# Patient Record
Sex: Female | Born: 1950 | Race: White | Hispanic: No | Marital: Married | Smoking: Former smoker
Health system: Southern US, Community
[De-identification: ages and names within clinical notes are randomized; demographics above are authoritative.]

## PROBLEM LIST (undated history)

## (undated) DIAGNOSIS — Z87442 Personal history of urinary calculi: Secondary | ICD-10-CM

## (undated) DIAGNOSIS — K219 Gastro-esophageal reflux disease without esophagitis: Secondary | ICD-10-CM

## (undated) DIAGNOSIS — Z8489 Family history of other specified conditions: Secondary | ICD-10-CM

## (undated) DIAGNOSIS — N393 Stress incontinence (female) (male): Secondary | ICD-10-CM

## (undated) DIAGNOSIS — I10 Essential (primary) hypertension: Secondary | ICD-10-CM

## (undated) DIAGNOSIS — M719 Bursopathy, unspecified: Secondary | ICD-10-CM

## (undated) DIAGNOSIS — Z860100 Personal history of colon polyps, unspecified: Secondary | ICD-10-CM

## (undated) DIAGNOSIS — F32A Depression, unspecified: Secondary | ICD-10-CM

## (undated) DIAGNOSIS — M1990 Unspecified osteoarthritis, unspecified site: Secondary | ICD-10-CM

## (undated) DIAGNOSIS — F329 Major depressive disorder, single episode, unspecified: Secondary | ICD-10-CM

## (undated) DIAGNOSIS — N200 Calculus of kidney: Secondary | ICD-10-CM

## (undated) DIAGNOSIS — N201 Calculus of ureter: Secondary | ICD-10-CM

## (undated) DIAGNOSIS — Z973 Presence of spectacles and contact lenses: Secondary | ICD-10-CM

## (undated) DIAGNOSIS — M5431 Sciatica, right side: Secondary | ICD-10-CM

## (undated) DIAGNOSIS — G43909 Migraine, unspecified, not intractable, without status migrainosus: Secondary | ICD-10-CM

## (undated) DIAGNOSIS — K449 Diaphragmatic hernia without obstruction or gangrene: Secondary | ICD-10-CM

## (undated) DIAGNOSIS — F419 Anxiety disorder, unspecified: Secondary | ICD-10-CM

## (undated) HISTORY — DX: Major depressive disorder, single episode, unspecified: F32.9

## (undated) HISTORY — DX: Essential (primary) hypertension: I10

## (undated) HISTORY — PX: EXTRACORPOREAL SHOCK WAVE LITHOTRIPSY: SHX1557

## (undated) HISTORY — DX: Gastro-esophageal reflux disease without esophagitis: K21.9

## (undated) HISTORY — DX: Depression, unspecified: F32.A

## (undated) HISTORY — PX: CARPAL TUNNEL RELEASE: SHX101

## (undated) HISTORY — PX: TONSILLECTOMY: SUR1361

---

## 1993-11-08 HISTORY — PX: NASAL SEPTUM SURGERY: SHX37

## 1998-07-10 ENCOUNTER — Other Ambulatory Visit: Admission: RE | Admit: 1998-07-10 | Discharge: 1998-07-10 | Payer: Self-pay | Admitting: Internal Medicine

## 1998-08-21 ENCOUNTER — Ambulatory Visit (HOSPITAL_COMMUNITY): Admission: RE | Admit: 1998-08-21 | Discharge: 1998-08-21 | Payer: Self-pay | Admitting: Gastroenterology

## 1999-06-30 ENCOUNTER — Other Ambulatory Visit: Admission: RE | Admit: 1999-06-30 | Discharge: 1999-06-30 | Payer: Self-pay | Admitting: Obstetrics and Gynecology

## 2000-09-07 ENCOUNTER — Other Ambulatory Visit: Admission: RE | Admit: 2000-09-07 | Discharge: 2000-09-07 | Payer: Self-pay | Admitting: Obstetrics and Gynecology

## 2000-10-07 ENCOUNTER — Encounter: Admission: RE | Admit: 2000-10-07 | Discharge: 2001-01-05 | Payer: Self-pay | Admitting: Internal Medicine

## 2000-12-26 ENCOUNTER — Emergency Department (HOSPITAL_COMMUNITY): Admission: EM | Admit: 2000-12-26 | Discharge: 2000-12-26 | Payer: Self-pay | Admitting: Emergency Medicine

## 2001-05-23 ENCOUNTER — Other Ambulatory Visit: Admission: RE | Admit: 2001-05-23 | Discharge: 2001-05-23 | Payer: Self-pay | Admitting: Otolaryngology

## 2001-05-25 ENCOUNTER — Encounter: Payer: Self-pay | Admitting: Emergency Medicine

## 2001-05-25 ENCOUNTER — Emergency Department (HOSPITAL_COMMUNITY): Admission: EM | Admit: 2001-05-25 | Discharge: 2001-05-25 | Payer: Self-pay | Admitting: Emergency Medicine

## 2001-06-23 ENCOUNTER — Ambulatory Visit (HOSPITAL_BASED_OUTPATIENT_CLINIC_OR_DEPARTMENT_OTHER): Admission: RE | Admit: 2001-06-23 | Discharge: 2001-06-23 | Payer: Self-pay | Admitting: Orthopedic Surgery

## 2001-10-20 HISTORY — PX: OTHER SURGICAL HISTORY: SHX169

## 2002-12-14 ENCOUNTER — Other Ambulatory Visit: Admission: RE | Admit: 2002-12-14 | Discharge: 2002-12-14 | Payer: Self-pay | Admitting: Obstetrics and Gynecology

## 2003-01-25 ENCOUNTER — Ambulatory Visit (HOSPITAL_COMMUNITY): Admission: RE | Admit: 2003-01-25 | Discharge: 2003-01-25 | Payer: Self-pay | Admitting: Gastroenterology

## 2003-01-25 ENCOUNTER — Encounter (INDEPENDENT_AMBULATORY_CARE_PROVIDER_SITE_OTHER): Payer: Self-pay | Admitting: *Deleted

## 2003-11-04 ENCOUNTER — Inpatient Hospital Stay (HOSPITAL_COMMUNITY): Admission: RE | Admit: 2003-11-04 | Discharge: 2003-11-07 | Payer: Self-pay | Admitting: Orthopedic Surgery

## 2003-11-04 HISTORY — PX: TOTAL KNEE ARTHROPLASTY: SHX125

## 2004-01-10 ENCOUNTER — Other Ambulatory Visit: Admission: RE | Admit: 2004-01-10 | Discharge: 2004-01-10 | Payer: Self-pay | Admitting: Obstetrics and Gynecology

## 2004-10-01 ENCOUNTER — Emergency Department (HOSPITAL_COMMUNITY): Admission: EM | Admit: 2004-10-01 | Discharge: 2004-10-01 | Payer: Self-pay | Admitting: Emergency Medicine

## 2005-05-04 ENCOUNTER — Encounter: Admission: RE | Admit: 2005-05-04 | Discharge: 2005-05-04 | Payer: Self-pay | Admitting: Internal Medicine

## 2005-05-18 ENCOUNTER — Ambulatory Visit (HOSPITAL_COMMUNITY): Admission: RE | Admit: 2005-05-18 | Discharge: 2005-05-18 | Payer: Self-pay | Admitting: Urology

## 2005-05-18 ENCOUNTER — Ambulatory Visit (HOSPITAL_BASED_OUTPATIENT_CLINIC_OR_DEPARTMENT_OTHER): Admission: RE | Admit: 2005-05-18 | Discharge: 2005-05-18 | Payer: Self-pay | Admitting: Urology

## 2005-05-18 HISTORY — PX: OTHER SURGICAL HISTORY: SHX169

## 2005-05-20 ENCOUNTER — Ambulatory Visit (HOSPITAL_COMMUNITY): Admission: RE | Admit: 2005-05-20 | Discharge: 2005-05-20 | Payer: Self-pay | Admitting: Urology

## 2005-06-07 ENCOUNTER — Ambulatory Visit (HOSPITAL_COMMUNITY): Admission: RE | Admit: 2005-06-07 | Discharge: 2005-06-07 | Payer: Self-pay | Admitting: Urology

## 2005-06-21 ENCOUNTER — Encounter: Admission: RE | Admit: 2005-06-21 | Discharge: 2005-06-21 | Payer: Self-pay | Admitting: Orthopedic Surgery

## 2005-07-26 ENCOUNTER — Ambulatory Visit (HOSPITAL_COMMUNITY): Admission: RE | Admit: 2005-07-26 | Discharge: 2005-07-26 | Payer: Self-pay | Admitting: Orthopedic Surgery

## 2005-07-26 ENCOUNTER — Ambulatory Visit (HOSPITAL_BASED_OUTPATIENT_CLINIC_OR_DEPARTMENT_OTHER): Admission: RE | Admit: 2005-07-26 | Discharge: 2005-07-26 | Payer: Self-pay | Admitting: Orthopedic Surgery

## 2005-07-26 HISTORY — PX: SHOULDER ARTHROSCOPY W/ ROTATOR CUFF REPAIR: SHX2400

## 2006-02-08 ENCOUNTER — Other Ambulatory Visit: Admission: RE | Admit: 2006-02-08 | Discharge: 2006-02-08 | Payer: Self-pay | Admitting: Obstetrics and Gynecology

## 2007-01-09 IMAGING — CR DG ABDOMEN 1V
2 series · 2 of 2 positions shown · non-contrast
Comparison: Unenhanced CT abdomen and pelvis 05/04/2005.

CLINICAL DATA: Right renal calculus. Preoperative evaluation.

ABDOMEN -  AP VIEW  05/18/2005:

[t abdomen supine (1 of 2)]
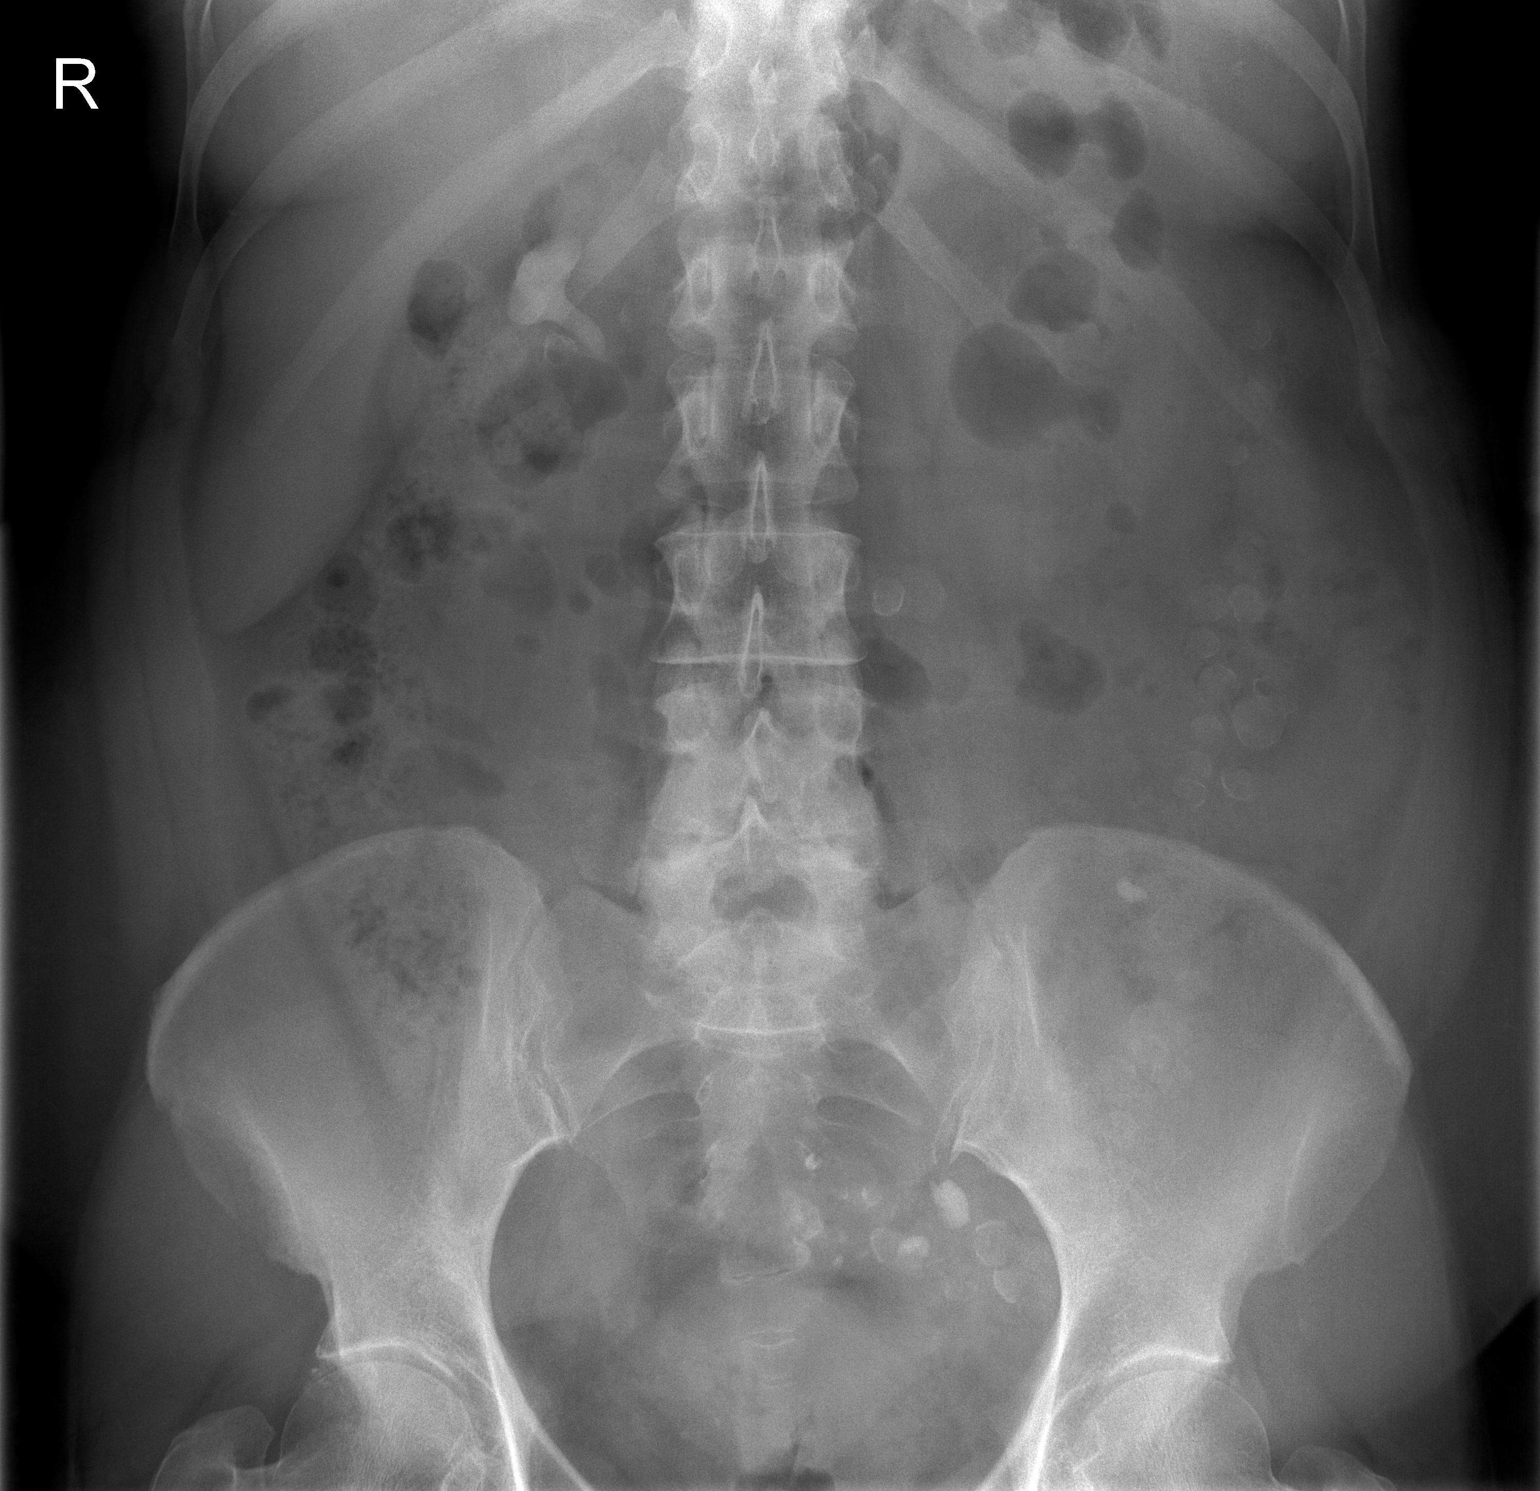

[t abdomen supine (2 of 2)]
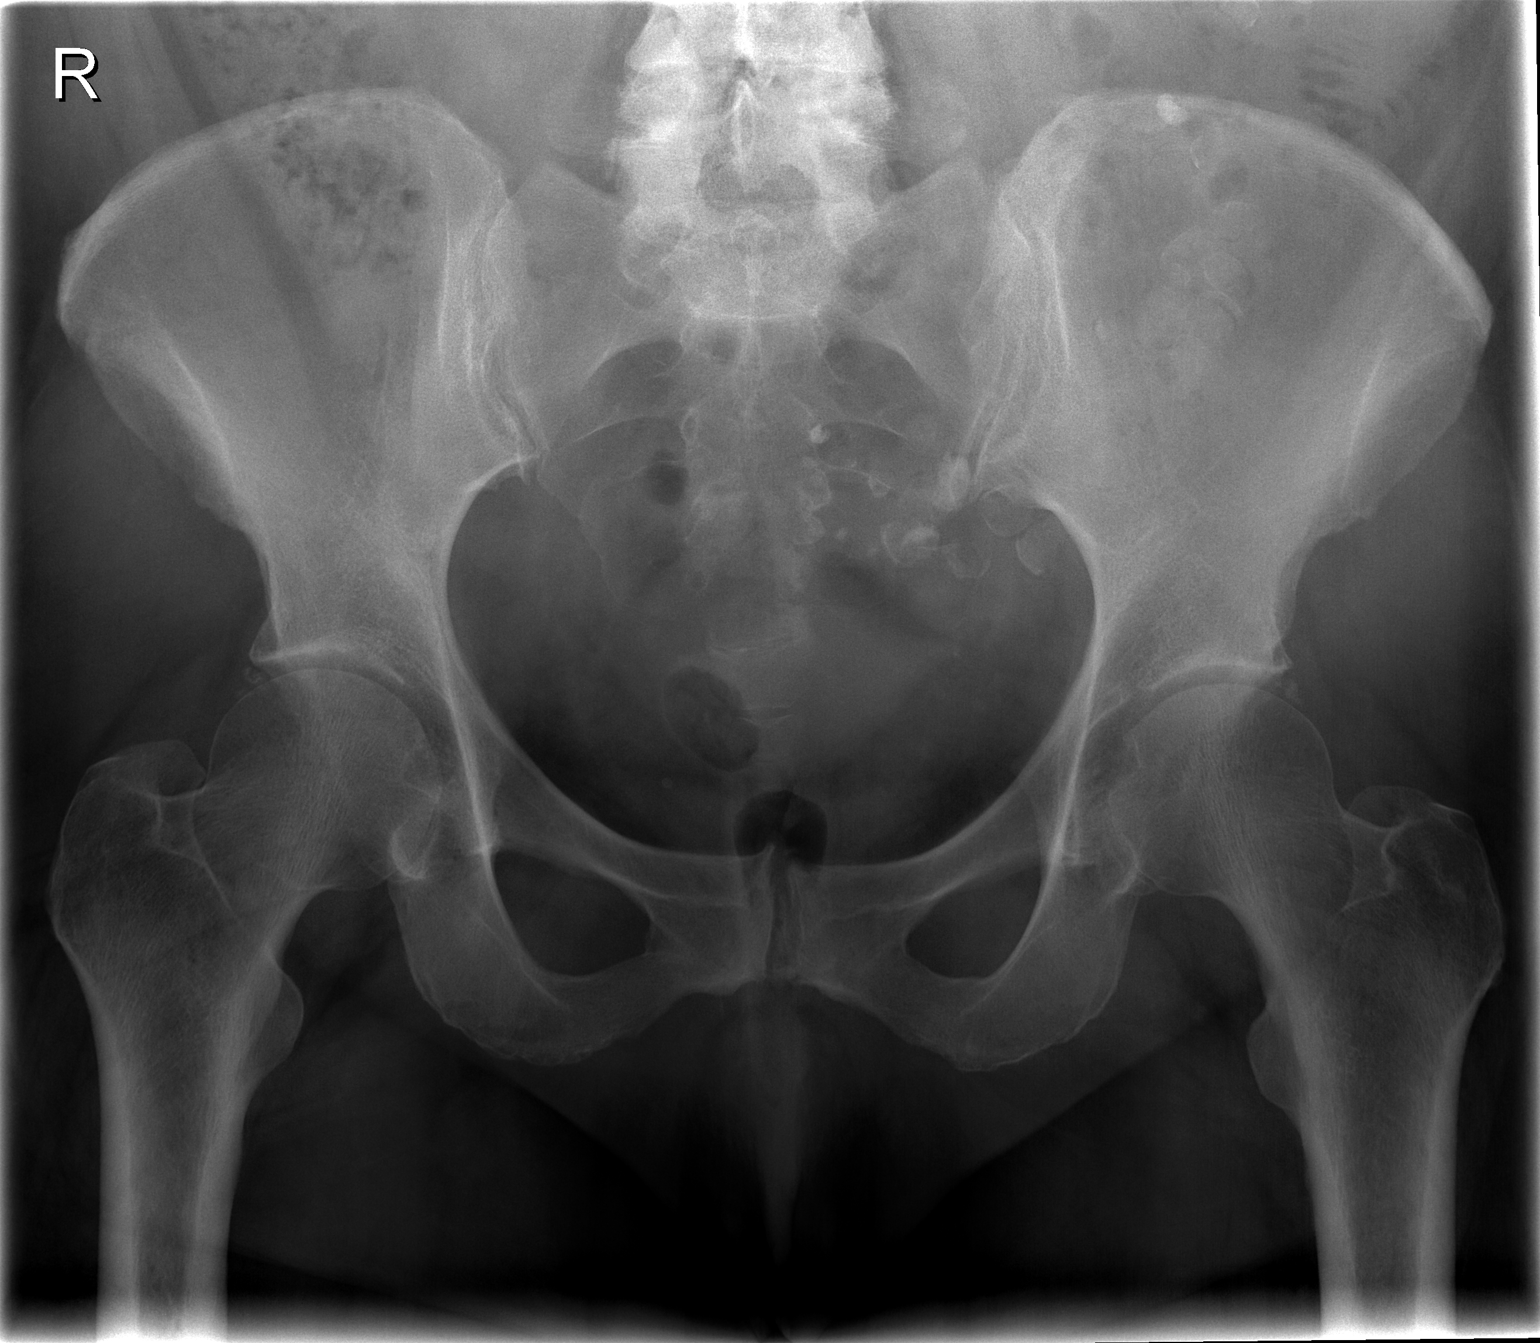

[2 of 2 positions shown; findings below may reference images not displayed]

FINDINGS: The partial right staghorn calculus is again demonstrated. No
definite ureteral calculi are identified. The left urinary tract calculi are
identified. Residual contrast material is present within numerous diverticula in
the transverse, descending and sigmoid colon. The bowel gas pattern is
unremarkable.
IMPRESSION: 1. Partial right staghorn calculus.
2. Numerous diverticula throughout the colon containing residual oral contrast
material.

## 2007-04-02 ENCOUNTER — Ambulatory Visit (HOSPITAL_COMMUNITY): Admission: RE | Admit: 2007-04-02 | Discharge: 2007-04-02 | Payer: Self-pay | Admitting: Emergency Medicine

## 2007-04-03 ENCOUNTER — Observation Stay (HOSPITAL_COMMUNITY): Admission: EM | Admit: 2007-04-03 | Discharge: 2007-04-04 | Payer: Self-pay | Admitting: Emergency Medicine

## 2007-04-03 ENCOUNTER — Encounter (INDEPENDENT_AMBULATORY_CARE_PROVIDER_SITE_OTHER): Payer: Self-pay | Admitting: Surgery

## 2007-04-03 HISTORY — PX: LAPAROSCOPIC APPENDECTOMY: SUR753

## 2007-08-03 ENCOUNTER — Ambulatory Visit (HOSPITAL_BASED_OUTPATIENT_CLINIC_OR_DEPARTMENT_OTHER): Admission: RE | Admit: 2007-08-03 | Discharge: 2007-08-03 | Payer: Self-pay | Admitting: Orthopedic Surgery

## 2007-08-23 ENCOUNTER — Ambulatory Visit (HOSPITAL_COMMUNITY): Admission: RE | Admit: 2007-08-23 | Discharge: 2007-08-23 | Payer: Self-pay | Admitting: Obstetrics and Gynecology

## 2007-08-23 HISTORY — PX: OTHER SURGICAL HISTORY: SHX169

## 2007-08-25 ENCOUNTER — Inpatient Hospital Stay (HOSPITAL_COMMUNITY): Admission: AD | Admit: 2007-08-25 | Discharge: 2007-08-25 | Payer: Self-pay | Admitting: Obstetrics and Gynecology

## 2007-09-08 ENCOUNTER — Ambulatory Visit (HOSPITAL_BASED_OUTPATIENT_CLINIC_OR_DEPARTMENT_OTHER): Admission: RE | Admit: 2007-09-08 | Discharge: 2007-09-08 | Payer: Self-pay | Admitting: Orthopedic Surgery

## 2007-10-26 ENCOUNTER — Encounter: Admission: RE | Admit: 2007-10-26 | Discharge: 2007-10-26 | Payer: Self-pay | Admitting: Internal Medicine

## 2008-09-02 ENCOUNTER — Ambulatory Visit (HOSPITAL_COMMUNITY): Admission: RE | Admit: 2008-09-02 | Discharge: 2008-09-02 | Payer: Self-pay | Admitting: Obstetrics and Gynecology

## 2008-09-02 HISTORY — PX: OTHER SURGICAL HISTORY: SHX169

## 2009-07-04 ENCOUNTER — Encounter: Admission: RE | Admit: 2009-07-04 | Discharge: 2009-07-04 | Payer: Self-pay | Admitting: Neurological Surgery

## 2009-08-04 ENCOUNTER — Encounter: Admission: RE | Admit: 2009-08-04 | Discharge: 2009-08-04 | Payer: Self-pay | Admitting: Orthopedic Surgery

## 2009-09-04 ENCOUNTER — Encounter: Admission: RE | Admit: 2009-09-04 | Discharge: 2009-09-04 | Payer: Self-pay | Admitting: Internal Medicine

## 2009-12-16 ENCOUNTER — Encounter: Admission: RE | Admit: 2009-12-16 | Discharge: 2009-12-16 | Payer: Self-pay | Admitting: Internal Medicine

## 2010-11-29 ENCOUNTER — Encounter: Payer: Self-pay | Admitting: Internal Medicine

## 2010-11-29 ENCOUNTER — Encounter: Payer: Self-pay | Admitting: Orthopedic Surgery

## 2011-03-23 NOTE — H&P (Signed)
[name], [name]                  ACCOUNT NO.:  [id number]   MEDICAL RECORD NO.:  [id number]           PATIENT TYPE:   LOCATION:                                 FACILITY:   PHYSICIAN:  Huel Cote, M.D. DATE OF BIRTH:  [birth year]   DATE OF ADMISSION:  09/23/2007  DATE OF DISCHARGE:                              HISTORY & PHYSICAL   Patient is a 60 year old G1, P1 who is coming in for a scheduled TVT  secure placement and posterior repair given an ongoing problem with a  symptomatic rectocele and significant stress urinary incontinence.  The  patient did have urodynamics testing performed in the office which  revealed a normal functioning bladder, however, there was evidence of a  significant stress urinary incontinence due to hypermobility.  There was  no ISD demonstrated and she had normal flow studies and post void  residuals.  She also has a rectocele which does cause her some pressure  and there is a bulge which has some irritation chronically from rubbing  on her underwear.   PAST MEDICAL HISTORY:  Is significant for chronic hypertension and  reflux disease.   PAST OBSTETRICAL HISTORY:  One normal spontaneous vaginal delivery.   PAST GYN HISTORY:  Does have a history of abnormal Pap smears with  cryosurgery x2 noted.   PAST SURGICAL HISTORY:  She has had nose surgery, carpal tunnel surgery  recently on one arm, tubal ligation, and tonsillectomy.   FAMILY HISTORY:  Is significant for breast cancer in her grandmother at  age 82 and a paternal aunt at age 31.  Her mother had an MI in her 32s  and there is no colon cancer.   CURRENT MEDICATIONS:  Include Prempro 0.3/1.5 daily, Protonix,  lisinopril and Celebrex.   ALLERGIES:  Include SULFA and ASPIRIN sensitivity.   PHYSICAL EXAM:  Her blood pressure is 140/90.  CARDIAC EXAM:  Is regular rate and rhythm.  LUNGS:  Clear.  ABDOMEN:  Soft and nontender.  PELVIC:  Has normal external genitalia noted.  Cervix has no  lesions.  There is a minimal cystocele appreciated and the uterus is normal in  size with good support noted.  The adnexa have no masses.  There is a  mild to moderate rectocele present.   Given the patient's significant stress urinary incontinence that did not  respond to any medical therapy or timed voiding therapy, the patient has  wished to proceed with a tension-free vaginal tape placement.  We  discussed all options including urology referral and she feels  comfortable proceeding with the TVT secure placement in conjunction with  a posterior repair.  All risks and benefits of both procedures were  discussed with the patient in detail by both myself and Dr. Jackelyn Knife  who will be performing the retropubic TVT placement.  Specifically we  discussed bleeding, infection and possible damage to bladder and bowel.  She understands these risks and desires to proceed with the surgery as  stated.  She is also aware that should the TVT secure be problematic,  that a traditional TVT sling  could be placed and would require  additional incision sites and is accepting of this possibility.      Huel Cote, M.D.  Electronically Signed     KR/MEDQ  D:  08/22/2007  T:  08/22/2007  Job:  [id number]

## 2011-03-23 NOTE — Op Note (Signed)
[name], [name]                  ACCOUNT NO.:  [id number]   MEDICAL RECORD NO.:  [id number]          PATIENT TYPE:  AMB   LOCATION:  DSC                          FACILITY:  [facility]   PHYSICIAN:  Katy Fitch. Sypher, M.D. 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   POSTOPERATIVE DIAGNOSIS:  Chronic entrapment neuropathy left median  nerve at wrist.   OPERATIONS:  Release of left transverse carpal ligament.   OPERATIONS:  Katy Fitch. Sypher, M.D.   ASSISTANT:  Molly Maduro Dasnoit PA-C.   ANESTHESIA:  General by LMA.   SUPERVISING ANESTHESIOLOGIST:  Burna Forts, M.D.   INDICATIONS:  The patient is a 60 year old woman referred for evaluation  and management of bilateral hand numbness.  Clinical examination  revealed signs of probable carpal tunnel syndrome.  Electrodiagnostic  studies confirmed significant bilateral carpal tunnel syndrome.   The patient has failed nonoperative measures including splinting,  activity modification and steroid injection into the ulnar bursa.   Due to a failure to respond to nonoperative measures, she is brought to  the operating room at this time for release of her left transverse  carpal ligament.   PROCEDURE:  The patient was brought to the operating room and placed in  supine position upon the operating table.   Following induction of general anesthesia by LMA technique, the left arm  was prepped with Betadine soap and solution and  sterilely draped.  A  pneumatic tourniquet was applied to the proximal left brachium.   Following exsanguination of the limb with Esmarch bandage the  arterial  tourniquet was inflated to 230 mmHg.  The procedure commenced with a  short incision in the line of the ring finger and the palm.  The  subcutaneous tissues were carefully  divided revealing the palmar fascia.  This was split longitudinally to reveal the common sensory branch of the  median nerve.  There was rather unusual anatomy at the distal margin of  the transverse carpal ligament.  There was a pair of muscle bellies that  were inserting on the ligament.  Care was taken to gently spread the  fibers with scissors, looking for an aberrant motor branch.  No aberrant  motor branch was identified.  The ligament was then released along its  ulnar border adjacent to the hook of the hamate extending into the  distal forearm.  This widely opened the carpal canal.  No mass or other  predicaments were noted.   Bleeding points along margin of the released ligament were  electrocauterized with bipolar current followed by repair of the skin  with intradermal 3-0 Prolene.   A compressive dressing was applied with a volar plaster splint  maintaining the wrist in 5 degrees of dorsiflexion.   There were no apparent complications.   For aftercare, the patient is provided a prescription for Dilaudid 2 mg  one or two tablets p.o. q.4-6h. p.r.n. pain 20  tablets without refill.      Katy Fitch Sypher, M.D.  Electronically Signed     RVS/MEDQ  D:  08/03/2007  T:  08/03/2007  Job:  [id number]   cc:   Thora Lance, M.D.

## 2011-03-23 NOTE — Op Note (Signed)
[name], [name]                  ACCOUNT NO.:  [id number]   MEDICAL RECORD NO.:  [id number]          PATIENT TYPE:  OBV   LOCATION:  9311                          FACILITY:  [facility]   PHYSICIAN:  Zenaida Niece, M.D.DATE OF BIRTH:  [birth year]   DATE OF PROCEDURE:  08/23/2007  DATE OF DISCHARGE:  08/23/2007                               OPERATIVE REPORT   PREOPERATIVE DIAGNOSIS:  Stress urinary incontinence.   PROCEDURE:  TVT Secur.   SURGEON:  Zenaida Niece, M.D.   ASSISTANT:  Huel Cote, MD   ANESTHESIA:  Spinal.   ESTIMATED BLOOD LOSS:  150 mL from the TVT Secur.   COMPLICATIONS:  None.   PROCEDURE IN DETAIL:  Dr. Senaida Ores had previously performed a  posterior colporrhaphy.  The patient's legs were slightly lowered in the  stirrups.  The vaginal mucosa was grasped anteriorly in the midline  approximately one to two fingerbreadths from the urethral meatus.  Local  anesthetic with 0.25% Marcaine was infiltrated beneath the vaginal  mucosa.  A vertical incision was made in the vaginal mucosa and the  edges were grasped with the Allis clamps.  Sharp dissection was used to  dissect beneath the vaginal mucosa laterally to the pubic ramus.  This  was done bilaterally with enough width to allow a knife handle to pass  to the pubic ramus.  The TVT Secur device was then opened.  The needle  was first passed on the patient's right side, passing it beneath the  vaginal mucosa and behind the pubic ramus into the obturator internus  muscle.  This was then repeated on the patient's left side.  Cystoscopy  was then performed which revealed a normal bladder and no injury to the  bladder or the urethra.  The TVT Secur device was slightly loose and the  needle on the right side was pushed in a little further and this  achieved good tension.  The needles were then removed without  difficulty.  The vaginal mucosa was then closed with running locking 2-0  Vicryl with adequate  closure and adequate hemostasis.  The patient was  then taken down from stirrups after all instruments were removed from  the vagina.  She was extubated in the operating and taken to the  recovery room in stable condition after tolerating the procedure well.  All counts were correct.  She did received Ancef 2 grams IV prior to the  procedure.      Zenaida Niece, M.D.  Electronically Signed     TDM/MEDQ  D:  08/23/2007  T:  08/24/2007  Job:  [id number]

## 2011-03-23 NOTE — Op Note (Signed)
NAMEABREA, HENLE                  ACCOUNT NO.:  [id number]   MEDICAL RECORD NO.:  [id number]          PATIENT TYPE:  AMB   LOCATION:  SDC                           FACILITY:  [facility]   PHYSICIAN:  Zenaida Niece, M.D.DATE OF BIRTH:  1951-04-01   DATE OF PROCEDURE:  09/02/2008  DATE OF DISCHARGE:                               OPERATIVE REPORT   PREOPERATIVE DIAGNOSIS:  Exposed portion of tension-free vaginal tape.   POSTOPERATIVE DIAGNOSIS:  Exposed portion of tension-free vaginal tape.   PROCEDURE:  Removal of the midportion of her tension-free vaginal tape.   SURGEON:  Zenaida Niece, MD   ANESTHESIA:  IV sedation and local block.   FINDINGS:  The edge of the TVT furthest in the vagina was exposed for  approximately 1 cm in length.   SPECIMENS:  None.   ESTIMATED BLOOD LOSS:  50 mL.   COMPLICATIONS:  None.   PROCEDURE IN DETAIL:  The patient was taken to the operating room and  placed in the dorsal supine position.  She was given IV sedation and  placed in mobile stirrups.  Perineum and vagina were then prepped and  draped in the usual sterile fashion and bladder drained with a red  Robinson catheter.  A short weighted speculum was placed into the vagina  and an [employer] retractor used anteriorly to try and see the portion of  the TVT that was palpable.  A 1-cm segment of the edge that was mostly  in the vagina was visible.  The vaginal edges around this were grasped  with Allis clamps.  The area was infiltrated with 0.5% Marcaine with  epinephrine.  A vertical incision was made inferior to the sling and  right over the sling.  Sharp dissection was then used to expose the  midportion of the sling as laterally as I could.  I was able to  undermine the sling and removed it from all surrounding tissue.  The  sling was then cut laterally first on the patient's left and then  pulling it was cut on the right to remove approximately the middle 1-2  cm of sling.  No further  sling was palpable or visible.  The incision  was then closed in a horizontal fashion with running locking 3-0 Vicryl  with adequate closure and adequate hemostasis.  Further palpation again  revealed no further sling palpable or visible.  All instruments were  then removed from the vagina.  The patient tolerated the procedure well  and she was taken to the recovery room in stable condition.  Counts were  correct x2, she received Ancef 1 g IV prior to the procedure and had PAS  hose on throughout the procedure.     Zenaida Niece, M.D.  Electronically Signed    TDM/MEDQ  D:  09/02/2008  T:  09/02/2008  Job:  [id number]

## 2011-03-23 NOTE — Discharge Summary (Signed)
[name], WEYANDT                  ACCOUNT NO.:  [id number]   MEDICAL RECORD NO.:  [id number]          PATIENT TYPE:  OBV   LOCATION:  9311                          FACILITY:  [facility]   PHYSICIAN:  Sherron Monday, MD        DATE OF BIRTH:  11/14/1950   DATE OF ADMISSION:  08/23/2007  DATE OF DISCHARGE:  08/23/2007                               DISCHARGE SUMMARY   PROCEDURE:  Posterior repair, TVT secure.   HPI:  A 60 year old G1, P1 who was coming in for a scheduled TVT secure  and posterior repair secondary to symptomatic rectocele and significant  stress urinary incontinence which was revealed by urodynamics in our  office.  She also has a large rectocele which causes pressure and has  constant irritation.   PAST MEDICAL HISTORY:  1. Chronic hypertension.  2. Reflux.   PAST OB/GYN HISTORY:  Term spontaneous vaginal delivery.  She has an  history of an abnormal Pap smear with cryosurgery x2, as well as a tubal  ligation.   PAST SURGICAL HISTORY:  1. Nose surgery.  2. Carpal tunnel surgery.  3. Tubal ligation.  4. Tonsillectomy.   FAMILY HISTORY:  Significant for breast cancer in her grandmother and a  paternal aunt.  Coronary artery disease in her mother.  No history of  any colon cancer.   CURRENT MEDICATIONS:  1. Prempro 0.3/1.5.  2. Protonix.  3. Lisinopril.  4. Celebrex.   ALLERGIES:  1. SULFA.  2. ASPIRIN WHICH IS MERELY A SENSITIVITY.   Please see the dictated H&P for physical exam.  She was admitted and  underwent the procedure without difficulty and approximately 200 mL  estimated blood loss.  Her postoperative course was relatively  uncomplicated.  She was  able to void well.  Her pain was well controlled.  She was discharged to  home on postoperative day #0 in the evening.  She was discharged to home  with routine discharge instructions and numbers to call if any questions  or problems, as well as prescription for Motrin and Vicodin.      Sherron Monday,  MD  Electronically Signed     JB/MEDQ  D:  08/23/2007  T:  08/24/2007  Job:  [id number]

## 2011-03-23 NOTE — Op Note (Signed)
NAMEADARA, KITTLE                  ACCOUNT NO.:  [id number]   MEDICAL RECORD NO.:  [id number]          PATIENT TYPE:  AMB   LOCATION:  DSC                          FACILITY:  [facility]   PHYSICIAN:  Katy Fitch. Sypher, M.D. DATE OF BIRTH:  03-05-1951   DATE OF PROCEDURE:  09/08/2007  DATE OF DISCHARGE:                               OPERATIVE REPORT   PREOPERATIVE DIAGNOSIS:  Entrapment neuropathy, median nerve, right  carpal tunnel.   POSTOPERATIVE DIAGNOSIS:  Entrapment neuropathy, median nerve, right  carpal tunnel.   OPERATION:  Release of right transverse carpal ligament.   OPERATING SURGEON:  Katy Fitch. Sypher, MD   ASSISTANT:  Annye Rusk PA-C.   ANESTHESIA:  General by LMA, supervising anesthesiologist is Mick Sell, MD   INDICATIONS:  Alicha Hammond is a 60 year old woman referred by Dr. Kirby Funk for evaluation and management of bilateral carpal tunnel  syndrome.  Clinical examination suggested median neuropathy at wrist  level.  Electrodiagnostic studies confirmed bilateral carpal tunnel  syndrome.   She is status post release of her left transverse carpal ligament with a  good result and now seeks release of the right transverse carpal  ligament.   After informed consent, she is brought to the operating room at this  time.   PROCEDURE:  Nailah Luepke was brought to operating room and placed in a  supine position upon the operating table.   Following the induction of general anesthesia by LMA technique, the  right arm was prepped with Betadine soap and solution and sterilely  draped.  A pneumatic tourniquet was applied to the proximal right  brachium.   Following exsanguination of the right arm with an Esmarch bandage, the  arterial tourniquet was inflated to 220 mmHg and later inflated to 250  mmHg due to the elevation of systolic blood pressure.   The procedure commenced with a short incisional in the line of the ring  finger in the palm.  The  subcutaneous tissues were carefully divided to  reveal the palmar fascia.  The anatomy of the mid palmar space was  obscured by adipose tissue.  With great care the superficial palmar  arch, its perforating vessels and the thenar muscles were isolated and  identified.  The transverse carpal ligament was identified and a  Agricultural engineer used to sound the carpal canal.  The transverse  carpal ligament was isolated and released along its ulnar border with  scissors extending into the distal forearm.   This widely opened the carpal canal.  No masses or other predicaments  were identified.   The wound was then repaired with intradermal 3-0 Prolene suture.  Pressure was held on the distal forearm and volar aspect of the wrist to  prevent formation of a hematoma.  The wound was then dressed with a  Steri-Strip, infiltrated with 2% lidocaine for postoperative analgesia,  and a volar plaster splint placed, maintaining the wrist in 5 degrees of  dorsiflexion.  There were no apparent complications.      Katy Fitch Sypher, M.D.  Electronically Signed  RVS/MEDQ  D:  09/08/2007  T:  09/08/2007  Job:  [id number]   cc:   Thora Lance, M.D.

## 2011-03-23 NOTE — Op Note (Signed)
[name], [name]                  ACCOUNT NO.:  [id number]   MEDICAL RECORD NO.:  [id number]          PATIENT TYPE:  OBV   LOCATION:  9311                          FACILITY:  [facility]   PHYSICIAN:  Huel Cote, M.D. DATE OF BIRTH:  08-Sep-1951   DATE OF PROCEDURE:  08/23/2007  DATE OF DISCHARGE:  08/23/2007                               OPERATIVE REPORT   PREOPERATIVE DIAGNOSES:  1. Rectocele.  2. Stress urinary incontinence.   POSTOPERATIVE DIAGNOSES:  1. Rectocele.  2. Stress urinary incontinence.   PROCEDURE:  Posterior repair and TVT Secur placement.   SURGEONS:  Dr. Huel Cote and Dr. Lavina Hamman.   ANESTHESIA:  General.   FINDINGS:  There was a grade 2 rectocele, repaired.  No specimens were  sent.   ESTIMATED BLOOD LOSS:  200 mL.   URINE OUTPUT:  Approximately 50 mL, straight cath prior to procedure, of  clear urine and another 50 mL during the procedure just after the sling  placement.   IV FLUIDS:  Approximately 1500 mL LR.   PROCEDURE IN DETAIL:  The patient was taken to the operating room where  general anesthesia was obtained without difficulty.  She was then  prepped and draped in normal sterile fashion in the dorsal lithotomy  position.  She was placed in the stirrups prior to anesthesia induction  to assure comfort  in lower extremities, given a knee replacement  surgery in the past and she assured [place] that she was comfortable.  The  patient was draped sterilely in the dorsal lithotomy position after  anesthesia induction and attention was turned to the vagina.  She was  straight cathed prior to procedure and the posterior perineum was  grasped with Allis clamps just at the introital ring.  The hymenal ring  was identified and these Allis clamps placed on it.  A small amount of  vaginal mucosa was trimmed away and the vaginal mucosa was then  underscored in the midline after injection with 0.25% Marcaine solution.  The vaginal mucosa was slowly  dissected off the underlying rectovaginal  fascia and this incision was carried up the midline of the vagina with  Allis clamps placed at the mucosal flap.  This was carried up to  approximately 3 cm below the cervix and the mucosal flaps were then  dissected off the underlying rectovaginal fascia with the Metzenbaum  scissors and the fascia itself was deflected to the midline.  When this  was performed bilaterally, 0 Vicryl was then utilized an interrupted  fashion to reapproximate the rectovaginal fascia and reduce the  rectocele.  This had good result.  At this point, clean gloved was  placed over the right and rectal exam performed with no sutures palpable  in the rectum and only one area of the rectocele felt nonsupported and  after the rectal exam, the glove was removed and one additional of 0  Vicryl interrupted suture was placed over this area.  Thus, with the  rectocele completely reduced, the excess vaginal mucosa was trimmed away  with Mayo scissors and was then closed in  a running locked fashion with  2-0 Vicryl.  Good hemostasis was noted and at this point the surgery was  turned over to Dr. Jackelyn Knife for the TVT Secur placement, which he has  dictated separately.  At the conclusion of the TVT Secur placement, the  patient had no significant active bleeding noted and there was not felt  to be a need for vaginal packing.  She had all instruments and sponges  removed from the vagina and was then awakened and taken to the recovery  room in stable condition she will be admitted as observation status for  possible discharge later today or in the morning following surgery.      Huel Cote, M.D.  Electronically Signed     KR/MEDQ  D:  08/23/2007  T:  08/24/2007  Job:  [id number]

## 2011-03-23 NOTE — H&P (Signed)
[name], [name]                  ACCOUNT NO.:  [id number]   MEDICAL RECORD NO.:  [id number]          PATIENT TYPE:  INP   LOCATION:  0098                         [facility]ACILITY:  [facility]   PHYSICIAN:  Wilmon Arms. Corliss Skains, M.D. DATE OF BIRTH:  [birth year]   DATE OF ADMISSION:  04/03/2007  DATE OF DISCHARGE:                              HISTORY & PHYSICAL   CHIEF COMPLAINT:  Right lower quadrant pain.   HISTORY OF PRESENT ILLNESS:  The patient is a 60 year old female who  presents with onset of diffuse abdominal pain in the middle of the day  on Saturday.  The pain has waxed and waned but has generally become  worse, and it is now localized mostly to the right lower quadrant.  The  patient feels distended.  She has had some diarrhea.  She was seen on an  outpatient basis on Sunday and was sent for a CT scan.  This showed  evidence of acute appendicitis.  She was then sent to the emergency  department for evaluation.   PAST MEDICAL HISTORY:  1. Gastroesophageal reflux.  2. Arthritis.  3. Hypertension.  4. Nephrolithiasis.   PAST SURGICAL HISTORY:  1. Laparoscopic tubal ligation.  2. Left total knee replacement.  3. Left shoulder and other orthopedic surgeries.  4. Cystoscopy with stent placement.  5. Lithotripsy.   FAMILY HISTORY:  None.   SOCIAL HISTORY:  Nonsmoker, nondrinker.   ALLERGIES:  SULFA.  She has itching with OXYCODONE.   MEDICATIONS:  Lisinopril, Prempro, Protonix, Celebrex.   PHYSICAL EXAMINATION:  VITAL SIGNS:  Temperature 98.1, pulse 68,  respirations 18, blood pressure 118/56.  GENERAL:  This is an obese female in no apparent distress.  HEENT:  EOMI.  Sclerae anicteric.  NECK:  No masses, no thyromegaly.  LUNGS: Clear.  Normal respiratory effort.  HEART: Regular rate and rhythm.  No murmur.  ABDOMEN:  Positive bowel sounds, soft and nontender in the right lower  quadrant.  The abdomen is moderately distended.  EXTREMITIES:  No edema.  Multiple incisions on the  lower extremities.  SKIN:  Warm and dry with no sign of jaundice.   LABS:  Hemoglobin 13.1, white count 11.6.  CT scan of the abdomen and  pelvis showed a hiatal hernia, diffuse colonic diverticulosis, acute  appendicitis extending across the pelvis towards the midline.  No sign  of abscess, and uterine fibroids.   IMPRESSION:  Acute appendicitis.   PLAN:  Recommend laparoscopic appendectomy, possible open.   I discussed the procedure with the patient and she wishes to proceed.      Wilmon Arms. Tsuei, M.D.  Electronically Signed     MKT/MEDQ  D:  04/03/2007  T:  04/03/2007  Job:  [id number]

## 2011-03-23 NOTE — Op Note (Signed)
[name], [name]                  ACCOUNT NO.:  [id number]   MEDICAL RECORD NO.:  [id number]          PATIENT TYPE:  AMB   LOCATION:  DSC                          FACILITY:  [facility]   PHYSICIAN:  Wilmon Arms. Corliss Skains, M.D. DATE OF BIRTH:  [birth year]   DATE OF PROCEDURE:  04/03/2007  DATE OF DISCHARGE:                               OPERATIVE REPORT   PREOPERATIVE DIAGNOSIS:  Acute appendicitis.   POSTOPERATIVE DIAGNOSIS:  Acute appendicitis.   PROCEDURE PERFORMED:  Laparoscopic appendectomy.   SURGEON:  Wilmon Arms. Tsuei, M.D.   ANESTHESIA:  General.   INDICATIONS:  The patient is a 60 year old female who presents with 2  days of diffuse abdominal pain, now localized to the right lower  quadrant.  A CT scan confirmed the findings of acute appendicitis, with  no sign of abscess.  She presents now for evaluation and treatment.   DESCRIPTION OF PROCEDURE:  The patient was brought to the operating room  and placed in the supine position on the operating room table, with her  left arm tucked.  After an adequate level of general anesthesia was  obtained, a Foley catheter was placed in a sterile technique.  The  patient's abdomen was prepped with Betadine and draped in sterile  fashion.  Her umbilicus was infiltrated with 0.25% Marcaine.  A  transverse incision was made through her old laparoscopy scar.  Dissection was carried down to the fascia, which was opened vertically.  The peritoneum was bluntly entered.  A stay suture of 0 Vicryl was  placed around the fascial opening.  The Hasson cannula was inserted into  the peritoneal cavity and secured with a stay suture.  Pneumoperitoneum  was obtained by insufflating CO2 and maintaining a maximum pressure of  15 mmHg.  The laparoscope was inserted.  No gross purulence was noted.  A 5 mm port was placed in the left lower quadrant, and another 5 mm port  in the right upper quadrant.  The scope was moved to the right upper  quadrant.  The  patient was positioned in Trendelenburg and tilted to her  left.  We mobilized the cecum.  An inflamed appendix was seen adherent,  in between loops of small bowel in the mid pelvis.  Blunt dissection was  used to free up the tip of the appendix.  The appendix appeared to be  intact but was inflamed.  There was some fibrinous exudate.  The  appendix was grasped with a Babcock clamp and elevated.  The  mesoappendix was then taken down with the harmonic scalpel.  We  identified the base of the appendix at the cecum.  This was amputated  with an Endo-GIA stapler.  The staple line was intact with no sign of  bleeding or leak.  The appendix was placed in an EndoCatch sack and  removed through the umbilical port site.  We re-inspected the right  lower quadrant.  The staple line again looked good.  We irrigated the  right lower quadrant as well as the mid pelvis.  The right ovary was  visualized and appeared  normal.  The ports were removed under direct  vision as  pneumoperitoneum was released.  The pursestring suture was tied down to  close the umbilical fascia.  Monocryl 4-0 was used to close skin  incisions.  Steri-Strips and clean dressings were applied.  The patient  was then extubated and brought to the recovery room in stable condition.  All sponge, instrument and needle counts correct.      Wilmon Arms. Tsuei, M.D.  Electronically Signed     MKT/MEDQ  D:  04/03/2007  T:  04/03/2007  Job:  [id number]

## 2011-03-26 NOTE — Op Note (Signed)
[name], CAVNESS                ACCOUNT NO.:  [id number]   MEDICAL RECORD NO.:  [id number]          PATIENT TYPE:  AMB   LOCATION:  NESC                         FACILITY:  [facility]   PHYSICIAN:  Boston Service, M.D.DATE OF BIRTH:  1951-09-20   DATE OF PROCEDURE:  05/18/2005  DATE OF DISCHARGE:                                 OPERATIVE REPORT   PREOPERATIVE DIAGNOSIS:  A 60 year old female, recurrent Proteus urinary  tract infection, CT scan showed small staghorn calculus occupying the upper  pole calyces on the right side, 22 mm.   POSTOPERATIVE DIAGNOSIS:  A 60 year old female, recurrent Proteus urinary  tract infection, CT scan showed small staghorn calculus occupying the upper  pole calyces on the right side, 22 mm.   PROCEDURE:  Cystoscopy, double-J stent done in anticipation of  extracorporeal shockwave lithotripsy.   UROLOGIST:  Boston Service, MD   ANESTHESIA:  General.   SPECIMENS:  None.   DRAINS:  6-Frenchm 26 cm double-J stent.   ESTIMATED BLOOD LOSS:  Minimal.   DESCRIPTION OF PROCEDURE:  The patient was prepped and draped in the dorsal  lithotomy position after institution of adequate level of general  anesthesia.  A well-lubricated 21-French panendoscope was gently inserted at  the urethral meatus, a normal urethra and sphincter, normal trigone and  orifices.  The bladder was carefully inspected with the 12 and 70 degrees  lenses and showed no evidence of obvious intravesical pathology.   Retrograde films were then performed using the blocking catheter, first at  the left ureteral orifice, which showed normal course and caliber of the  ureter, pelvis and calyces on the left side.  Films were somewhat difficult  to interpret due to retained contrast within diverticula of the descending  and sigmoid colon, which were overlying the path of the ureter.  A similar  technique was used on the right side with a blocking catheter, normal course  and caliber of  the ureter and pelvis.  There was a 22 mm filling defect  occupying the upper pole calyces consistent with small staghorn notified  on CT scan.   After retrograde films had been obtained, end-hole catheter was passed at  the right ureteral orifice, passed to the renal pelvis, length of the ureter  approximately 26 cm.  A 26 cm, 6-French double-J stent was selected.  A  floppy-tip guidewire was inserted, double-J stent was passed over the  guidewire with excellent pigtail formation in the right  renal pelvis and within the bladder.  There was clear efflux of pink-tinged  urine through the fenestrations of the double-J stent.  The bladder was  drained.  Cystoscope was removed.  The patient was given a B&O suppository  and returned to recovery in satisfactory condition.       RH/MEDQ  D:  05/18/2005  T:  05/18/2005  Job:  [id number]   cc:   Thora Lance, M.D.  [address]  [place]  [place] [address]  Fax: [phone]

## 2011-03-26 NOTE — Op Note (Signed)
[place]  Patient:    [name], [name] Visit Number: [id number] MRN: [id number]          Service Type: DSU Location: [facility] Attending Physician:  Susa Day Dictated by:   Katy Fitch Naaman Plummer., M.D. Proc. Date: 10/20/01 Admit Date:  06/23/2001 Discharge Date: 06/23/2001                             Operative Report  PREOPERATIVE DIAGNOSIS:  Probable wood splinter, foreign body dorsal aspect of right long finger with development of foreign body granuloma.  POSTOPERATIVE DIAGNOSIS:  Probable wood splinter, foreign body dorsal aspect of right long finger with development of foreign body granuloma.  OPERATION PERFORMED:  Exploration of dorsal aspect of right long finger with debridement of foreign body granuloma and biopsy.  SURGEON:  Katy Fitch. Sypher, Montez Hageman., M.D.  ASSISTANT:  Jonni Sanger, P.A.  AESTHESIA:  2% lidocaine digital block.  INDICATIONS FOR PROCEDURE:  The patient is a 60 year old woman employed by [employer], who sustained a penetrating injury to the dorsal aspect of her right long finger.  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  She was seen by a primary care center where she was thought to have a possible foreign body.  It was not clear whether the entire foreign body was removed.  She has a very firm area on the dorsal aspect of her right long middle phalanx and thickening of the epitenon and subdermal tissues consistent with a probable foreign body granuloma.  DESCRIPTION OF PROCEDURE:  The patient was brought to the minor operating room and placed in supine position on the operating table.  Following placement of a 2% lidocaine metacarpal head level block, the right long finger was exsanguinated with a gauze wrap and a half inch Penrose drain was placed as a digital tourniquet.  When anesthesia was complete, the procedure  commenced with a curvilinear incision on the dorsal aspect of the middle phalanx.  The subcutaneous tissues were carefully divided revealing a thickened fibrotic material consistent with a foreign body reaction.  This was meticulously dissected off the extensor tendon and the surrounding periosteum of the middle phalanx.  I could not identify for certain a metallic or wooden foreign body.  The entire granuloma was removed and passed off for pathologic evaluation. The wound was then irrigated and repaired with interrupted sutures of 5-0 nylon.  A compressive dressing was applied with Xeroflo, sterile gauze and an Alumafoam splint.  [name] was advised to return to our office for follow-up in approximately 7 to 10 days.  She was given prescriptions for Tylenol #3 one or two tablets p.o. q.4-6h. p.r.n. pain, 20 tablets without refill. Dictated by:   Katy Fitch Naaman Plummer., M.D. Attending Physician:  Susa Day DD:  10/20/01 TD:  10/20/01 Job: [id number]

## 2011-03-26 NOTE — Op Note (Signed)
   NAME:  [name], INTRIERI                          ACCOUNT NO.:  [id number]   MEDICAL RECORD NO.:  [id number]                   PATIENT TYPE:  AMB   LOCATION:  ENDO                                 FACILITY:  [facility]   PHYSICIAN:  Danise Edge, M.D.                DATE OF BIRTH:  October 10, 1951   DATE OF PROCEDURE:  01/25/2003  DATE OF DISCHARGE:                                 OPERATIVE REPORT   PROCEDURE:  Colonoscopy.   ENDOSCOPIST:  Danise Edge, M.D.   REFERRING PHYSICIAN:  Thora Lance, M.D.   INDICATIONS:  The patient is a 60 year old female born Dec 08, 1950.  The patient  underwent a colonoscopy in December 1999; neoplastic polyps  were removed.  She is scheduled to undergo a screening colonoscopy with  polypectomy to prevent colon cancer.   PREMEDICATION:  Versed 5 mg, Demerol 50 mg.   ENDOSCOPE:  Olympus pediatric colonoscope.   DESCRIPTION OF PROCEDURE:  After obtaining informed consent, the patient was  placed in the left lateral decubitus position.  I administered intravenous  Demerol and intravenous Versed to achieve conscious sedation for the  procedure.  The patient's blood pressure, oxygen saturation and cardiac  rhythm were monitored throughout the procedure and documented in the medical  record.  Anal inspection was normal.  Digital rectal examination was normal.   The Olympus pediatric video colonoscope was introduced into the rectum and  easily advanced to the cecum.  A normal appearing ileocecal valve was  intubated and the distal ileum inspected.  Colonic preparation for the  examination today was excellent.   FINDINGS:  1. Rectum:  Three diminutive polyps were removed from the proximal rectum     with the cold biopsy forceps.  2. Sigmoid colon and descending colon:  Left colonic diverticulosis.  3. Splenic flexure:  Normal.  4. Transverse colon:  Normal.  5. Hepatic flexure:  Normal.  6. Ascending colon:  Normal.  7. Cecum and ileocecal  valve:  Normal.  8. Distal ileum:  Normal.   ASSESSMENT:  1. Three diminutive polyps were removed from the proximal rectum.  2. Left colonic diverticulosis.   RECOMMENDATIONS:  Repeat colonoscopy in five years.                                               Danise Edge, M.D.    MJ/MEDQ  D:  01/25/2003  T:  01/26/2003  Job:  [id number]   cc:   Thora Lance, M.D.  [address]  [place]  [place] [address]  Fax: [phone]

## 2011-03-26 NOTE — Op Note (Signed)
NAME:  [name], [name]                          ACCOUNT NO.:  [id number]   MEDICAL RECORD NO.:  [id number]                   PATIENT TYPE:  INP   LOCATION:  2899                                 FACILITY:  [facility]   PHYSICIAN:  Elana Alm. Thurston Hole, M.D.              DATE OF BIRTH:  [birth year]   DATE OF PROCEDURE:  11/04/2003  DATE OF DISCHARGE:                                 OPERATIVE REPORT   PREOPERATIVE DIAGNOSIS:  Left knee degenerative joint disease.   POSTOPERATIVE DIAGNOSIS:  Left knee degenerative joint disease.   OPERATION PERFORMED:  Left total knee replacement using Osteonics Scorpio  total knee system with a #9 cemented femoral component, #7 cemented tibial  component with a 12 mm  polyethylene flexed tibial spacer and 26 mm  polyethylene cemented patella.   SURGEON:  Elana Alm. Thurston Hole, M.D.   ASSISTANT:  Julien Girt, P.A.   ANESTHESIA:  General.   OPERATIVE TIME:  One hour and .   COMPLICATIONS:  None.   DESCRIPTION OF PROCEDURE:  [name] was brought to the operating room on  November 04, 2003, placed on the operating table in supine position.  After  an adequate level of general anesthesia was obtained, her left knee was  examined under anesthesia.  Her range of motion from -5 to 125 degrees with  moderate varus deformity and knee stable to ligamentous exam with normal  patellar tracking.  The left leg was then prepped using sterile DuraPrep and  draped using sterile technique after a Foley catheter had been placed under  sterile conditions.  She received Ancef 1 gm IV preoperatively for  prophylaxis.  The left leg was exsanguinated and a thigh tourniquet elevated  to 375 mmHg.  Initially through a 15 cm longitudinal incision based over the  patella, exposure was made.  The underlying subcutaneous tissue were incised  in line with the skin incision.  A mid vastus approach was made with an  arthrotomy.  After this was done, the patella was subluxed  laterally and the  articular surfaces were inspected.  She had grade 4 changes medially, grade  3 changes laterally and grade 2and 4 changes in the patellofemoral joint.  The medial and lateral meniscal remnants were removed as well as the  anterior cruciate ligament. The osteophytes were removed off the femoral  condyles and medial tibial plateau.  The intramedullary drill was then  drilled up the femoral canal for placement of the distal femoral cutting jig  which was placed in the appropriate amount of rotation and the distal 12 mm  cut was made.  After this was done, the distal femur was sized.  A  #9 was  found to be the appropriate size and a #9 cutting jig was placed and then  these cuts were made. After this was done, the proximal tibia was exposed.  The tibial spines were removed  with an oscillating  saw.  Intramedullary  drill drilled down the tibial canal for placement of the proximal tibial  cutting jig which was placed in the appropriate amount of rotation and a  proximal 6 mm cut was made.  After this was done, the Scorpio PCL cutter was  placed back on the distal femur and these cuts were made.  At this point  then, the  #9 femoral trial was placed.  A #7 tibial base plate trial was  placed and with a 12 mm polyethylene spacer there was found to be excellent  restoration of normal alignment and excellent stability.  Range of motion 0  to 125 degrees with a varus deformity corrected.  After this was done, the  tibial plate was checked and marked for rotation and then the keel cut was  made.  After this was done, the patella was sized.  a 26 mm was found to be  the appropriate size and a recessed 10 mm by 26 mm cut was made and three  locking holes were placed.  After this was done, it was felt that all the  trial components were of excellent size, fit and stability.  They were then  removed and the knee was then jet lavage irrigated with 3 L of saline  solution.  The proximal  tibia was then exposed and a #7 tibial base plate  with cement backing was hammered in position with an excellent fit with  excess cement being removed from around the edges.  Then a #9 femoral  component with cement backing was hammered into position also with an  excellent fit with excess cement being removed from around the edges.  The  12 mm polyethylene spacer was locked on the tibial base plate and the knee  taken through a range of motion of 0 to 120 degrees with excellent  stability.  The 26 mm polyethylene cement backed patella was then placed and  locked into its recessed hole and held there with a clamp.  After the cement  hardened, patellofemoral tracking was evaluated and this was found to be  normal.  At this point it was felt that all the components were of excellent  size, fit and stability.  The arthrotomy and vastus incision was repaired  with the vastus incision being repaired with 0 Vicryl over two medium  Hemovac drains and the arthrotomy being closed with #1 Ethibond.  Subcutaneous tissue closed with 0 and 2-0 Vicryl.  Skin closed with skin  staples.  Sterile dressings were applied.  The Hemovac injected with 0.25%  Marcaine with epinephrine and clamped.  Tourniquet was released and the  patient awakened and taken to recovery in stable condition.  The sponge and  needle counts were correct times two at the end of this case.                                               Robert A. Thurston Hole, M.D.    RAW/MEDQ  D:  11/04/2003  T:  11/04/2003  Job:  [id number]

## 2011-03-26 NOTE — Discharge Summary (Signed)
NAME:  [name], [name]                          ACCOUNT NO.:  [id number]   MEDICAL RECORD NO.:  [id number]                   PATIENT TYPE:  INP   LOCATION:  5013                                 FACILITY:  [facility]   PHYSICIAN:  Elana Alm. Thurston Hole, M.D.              DATE OF BIRTH:  [birth year]   DATE OF ADMISSION:  11/04/2003  DATE OF DISCHARGE:  11/07/2003                                 DISCHARGE SUMMARY   ADMISSION DIAGNOSES:  1. End-stage degenerative joint disease, left knee.  2. Hypertension.  3. Gastroesophageal reflux.   DISCHARGE DIAGNOSES:  1. End-stage degenerative joint disease, left knee.  2. Hypertension.  3. Gastroesophageal reflux.  4. Hypokalemia.  5. Urinary tract infection.   HISTORY OF PRESENT ILLNESS:  The patient is a 60 year old female who has a  longstanding history of left knee DJD.  She has tried anti-inflammatories,  cortisone injections and arthroscopic debridement without success.  At this  point in time, she has pain at night, pain with rest, unrelieved by  anything.  She understands the risks, benefits and possible complications of  a left total knee replacement and is without question.   PROCEDURES IN HOUSE:  On November 04, 2003, the patient underwent a left  total knee replacement by Dr. Thurston Hole as well as a femoral nerve block by  anesthesia.  She tolerated both procedures well and was admitted  postoperatively for pain control, physical therapy and DVT prophylaxis.   Postoperative day one temperature was 98.8, hemoglobin was 12.1.  She was  metabolically stable.  Her INR was 1.1.  She had a UA preoperatively that  did show a UTI.  A chest CT was ordered because she had two densities seen  on her chest x-ray.  She was placed on Cipro for her UTI 500 mg one p.o.  b.i.d.  She was up with physical therapy and was placed on Coumadin for DVT  prophylaxis as well as Lovenox.   Postoperative date two she was progressing well.  T-max of 100.2, hemoglobin  was 10.9.  The surgical wound was well approximated and her hemoglobin was  3.3.  She was continued on Cipro for her UTI.  She was given potassium 40  mEq p.o. for her hypokalemia.  Her Foley was DC'd and her chest CT showed  left upper lobe and left lower lobe subcentimeter pulmonary nodules that are  noncalcified.  They recommended that she get another chest CT in three to  six months to confirm stability.  She also has bilateral lower lobe  scarring.  She will need another chest CT in three months.  She progressed  well with physical therapy.   Postoperative day three her T-max was 100.3.  Her INR was 1.4.  Her  hemoglobin was 10.6.  The surgical wound was well approximated.  She was  discharged to home in stable condition on Cipro, Percocet, Robaxin,  OxyContin CR 10  mg twice a day, Coumadin, lisinopril and Protonix.  She is  weightbearing as tolerated, on a regular diet.  She has been instructed to  buy Colace and Senokot at the pharmacy to prevent constipation.  She has  been instructed to  call with increased pain, increased redness, increased drainage, or a temp  greater than 101.  She is a on a regular diet, weightbearing as tolerated.  She will get home health physical therapy and occupational therapy.  We will  see her back in the office on January 10.      Kirstin Shepperson, P.A.                  Robert A. Thurston Hole, M.D.    KS/MEDQ  D:  12/24/2003  T:  12/24/2003  Job:  [id number]

## 2011-03-26 NOTE — Op Note (Signed)
[name], Hammond                ACCOUNT NO.:  [id number]   MEDICAL RECORD NO.:  [id number]          PATIENT TYPE:  AMB   LOCATION:  DSC                          FACILITY:  [facility]   PHYSICIAN:  Robert A. Thurston Hole, M.D. DATE OF BIRTH:  1951-05-18   DATE OF PROCEDURE:  07/26/2005  DATE OF DISCHARGE:                                 OPERATIVE REPORT   PREOPERATIVE DIAGNOSIS:  1.  Left shoulder rotator cuff partial tear with impingement and AC joint      arthrosis.  2.  Left total knee arthrofibrosis.   POSTOPERATIVE DIAGNOSIS:  1.  Left shoulder rotator cuff partial tear with impingement and AC joint      arthrosis.  2.  Left total knee arthrofibrosis.   PROCEDURE:  1.  Left shoulder EUA followed by arthroscopic partial rotator cuff tear      debridement.  2.  Left shoulder subacromial decompression.  3.  Left shoulder distal clavicle excision.  4.  Left total knee manipulation with cortisone injection.   SURGEON:  Elana Alm. Thurston Hole, M.D.   ANESTHESIA:  General.   OPERATIVE TIME:  1 hour.   COMPLICATIONS:  None.   INDICATIONS FOR PROCEDURE:  [name] is a 54-year woman who has had six  to eight months of increasing left shoulder pain with exam and MRI  documenting rotator cuff tendinitis with impingement and AC joint  arthropathy, who has failed conservative care and is now to undergo  arthroscopy. She also had persistent difficulty regaining motion two years  status post left total knee replacement with flexion only to 90 degrees,  full extension and is now to undergo manipulation with injection.   DESCRIPTION:  [name] was brought to operating room on July 26, 2005 after an interscalene block had been placed in the holding room by  anesthesia. She was placed on the operating table in supine position. She  received Ancef 1 gram IV preoperatively for prophylaxis. After being placed  under general anesthesia, initially her knee was taken through range of  motion  from 0 to 90 degrees. A gentle manipulation was carried out breaking  up soft adhesions and improving flexion to 120 degrees.  The knee remained  stable with normal patellar tracking. The knee was then sterilely injected  with 40 milligrams of Depo-Medrol and 10 mL of 0.25%  Marcaine with  epinephrine. The left shoulder was then examined. She had full range of  motion and her shoulder was stable to ligamentous exam. She was then placed  in beach chair position and her shoulder arm was prepped using sterile  DuraPrep and draped using sterile technique. Originally through a posterior  arthroscopic portal, the arthroscope with a pump attached was placed and  through an anterior portal, arthroscopic probe was placed. On initial  inspection the articular cartilage of the glenohumeral joint was intact.  Anterior labrum partial tearing 25% which was debrided. Superior labrum,  biceps tendon anchor was intact. Biceps tendon was intact. The inferior  labrum and anterior inferior glenohumeral ligament complex was intact.  Rotator cuff showed small partial tearing 25%  of the supraspinatus and  infraspinatus which was debrided. The rest of the rotator cuff was intact.  The inferior capsular recess was free of pathology. After this was done the  subacromial space was entered. A lateral arthroscopic portal was made. Large  amount of bursitis was resected. The rotator cuff was frayed and partially  torn 20% on the bursal surface and this was debrided. Impingement was noted  and a subacromial decompression was carried out removing 68 mm of the  undersurface of the anterior, anterolateral, anteromedial acromion and CA  ligament release carried out as well. The [facility] joint showed significant  impingement in the subacromial space and spurring and  the distal 5 to 6 mm  of clavicle was resected with a 6 mm bur. After this was done, shoulder  could be brought through a full range of motion with no impingement on  the  rotator cuff. At this point it was felt that all pathology been  satisfactorily addressed. The instruments were removed. Portals closed with  3-0 nylon suture. Sterile dressings and a sling applied and the patient  awakened and taken to recovery room in stable condition.   FOLLOW-UP CARE:  [name] will be followed as an outpatient on Percocet  for pain and Celebrex with early physical therapy.  See her back in the  office in a week for sutures out and follow-up.      Robert A. Thurston Hole, M.D.  Electronically Signed     RAW/MEDQ  D:  07/26/2005  T:  07/26/2005  Job:  [id number]

## 2011-04-19 ENCOUNTER — Other Ambulatory Visit: Payer: Self-pay | Admitting: Internal Medicine

## 2011-04-19 DIAGNOSIS — Z1231 Encounter for screening mammogram for malignant neoplasm of breast: Secondary | ICD-10-CM

## 2011-04-28 ENCOUNTER — Ambulatory Visit
Admission: RE | Admit: 2011-04-28 | Discharge: 2011-04-28 | Disposition: A | Discharge status: Home / Self Care | Payer: 59 | Source: Ambulatory Visit | Attending: Internal Medicine | Admitting: Internal Medicine

## 2011-04-28 DIAGNOSIS — Z1231 Encounter for screening mammogram for malignant neoplasm of breast: Secondary | ICD-10-CM

## 2011-08-09 LAB — BASIC METABOLIC PANEL
Creatinine, Ser: 0.69
GFR calc Af Amer: >60
Sodium: 138

## 2011-08-09 LAB — CBC
Hemoglobin: 13.7
MCHC: 32.9
Platelets: 347
RBC: 4.93
WBC: 6.4

## 2011-08-18 LAB — BASIC METABOLIC PANEL
BUN: 12
CO2: 26
Calcium: 9.4
Glucose, Bld: 95

## 2011-08-18 LAB — POCT HEMOGLOBIN-HEMACUE
Hemoglobin: 11.6 — ABNORMAL LOW
Operator id: 112821

## 2011-08-18 LAB — HEMOGLOBIN AND HEMATOCRIT, BLOOD: HCT: 36.5

## 2011-08-19 LAB — BASIC METABOLIC PANEL
BUN: 19
CO2: 29
Calcium: 9.8
Creatinine, Ser: 0.77
GFR calc Af Amer: >60
GFR calc non Af Amer: >60

## 2011-08-19 LAB — BASIC METABOLIC PANEL WITH GFR
BUN: 20
CO2: 25
Calcium: 9.7
Chloride: 109
Creatinine, Ser: 0.95
GFR calc Af Amer: >60
GFR calc non Af Amer: >60
Glucose, Bld: 144 — ABNORMAL HIGH
Potassium: 3.4 — ABNORMAL LOW
Sodium: 139

## 2011-08-19 LAB — CBC
HCT: 40.9
Hemoglobin: 14.1
MCHC: 34.3
MCV: 84.6
Platelets: 365
RBC: 4.84
RDW: 14.1 — ABNORMAL HIGH
WBC: 8.3

## 2011-08-24 ENCOUNTER — Other Ambulatory Visit (HOSPITAL_COMMUNITY): Payer: 59

## 2011-08-30 ENCOUNTER — Inpatient Hospital Stay (HOSPITAL_COMMUNITY): Admission: RE | Admit: 2011-08-30 | Payer: 59 | Source: Ambulatory Visit | Admitting: Orthopedic Surgery

## 2011-10-05 ENCOUNTER — Other Ambulatory Visit (HOSPITAL_COMMUNITY): Payer: 59

## 2011-10-11 ENCOUNTER — Inpatient Hospital Stay: Admit: 2011-10-11 | Payer: Self-pay | Admitting: Orthopedic Surgery

## 2011-10-11 SURGERY — ARTHROPLASTY, KNEE, TOTAL
Anesthesia: General | Laterality: Right

## 2012-01-31 ENCOUNTER — Ambulatory Visit
Admission: RE | Admit: 2012-01-31 | Discharge: 2012-01-31 | Disposition: A | Discharge status: Home / Self Care | Payer: 59 | Source: Ambulatory Visit | Attending: Internal Medicine | Admitting: Internal Medicine

## 2012-01-31 ENCOUNTER — Other Ambulatory Visit: Payer: Self-pay | Admitting: *Deleted

## 2012-01-31 ENCOUNTER — Ambulatory Visit
Admission: RE | Admit: 2012-01-31 | Discharge: 2012-01-31 | Disposition: A | Discharge status: Home / Self Care | Payer: 59 | Source: Ambulatory Visit | Attending: *Deleted | Admitting: *Deleted

## 2012-01-31 ENCOUNTER — Other Ambulatory Visit: Payer: Self-pay | Admitting: Internal Medicine

## 2012-01-31 DIAGNOSIS — S0990XA Unspecified injury of head, initial encounter: Secondary | ICD-10-CM

## 2012-01-31 DIAGNOSIS — M25539 Pain in unspecified wrist: Secondary | ICD-10-CM

## 2012-01-31 DIAGNOSIS — M25519 Pain in unspecified shoulder: Secondary | ICD-10-CM

## 2012-02-04 ENCOUNTER — Encounter (HOSPITAL_COMMUNITY): Payer: Self-pay | Admitting: Pharmacy Technician

## 2012-02-08 ENCOUNTER — Encounter: Payer: Self-pay | Admitting: Physician Assistant

## 2012-02-08 ENCOUNTER — Other Ambulatory Visit: Payer: Self-pay | Admitting: Physician Assistant

## 2012-02-08 NOTE — H&P (Signed)
[name] is an 61 y.o. female.   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  The excruciating pain is getting progressively worse, limiting activities of daily living, poses a significant fall risk, and has failed multiple conservative treatments including intraarticular cortisone injections, intraarticular Supartz, bracing, medication and home physical therapy.  Past Medical History  Diagnosis Date  . Arthritis   . Right knee DJD   . Left knee DJD 2004    left total knee replacement in 2004  . Depression   . Hypertension   . GERD (gastroesophageal reflux disease)     Past Surgical History  Procedure Date  . Joint replacement 2004    left total knee replacement  . Ankle arthroscopy   . Tonsillectomy   . Appendectomy   . Right ring finger   . Carpal tunnel release     both wrists  . Shoulder arthroscopy w/ rotator cuff repair     left    Family History  Problem Relation Age of Onset  . Arthritis Mother   . Heart attack Mother   . Glaucoma Mother   . Alcohol abuse Father   . Seizures Father   . Arthritis Brother   . Hypertension Brother    Social History:  reports that she quit smoking about 16 years ago. She has never used smokeless tobacco. She reports that she does not drink alcohol or use illicit drugs.  Allergies:  Allergies  Allergen Reactions  . Hydrocodone Itching  . Sulfa Antibiotics Itching    Medications Prior to Admission  Medication Sig Dispense Refill  . celecoxib (CELEBREX) 200 MG capsule Take 200 mg by mouth daily.      . citalopram (CELEXA) 40 MG tablet Take 40 mg by mouth daily.      . diphenhydrAMINE (BENADRYL) 25 MG tablet Take 75 mg by mouth 2 (two) times daily as needed. For allergies      . lisinopril-hydrochlorothiazide (PRINZIDE,ZESTORETIC) 10-12.5 MG per tablet Take 1 tablet by mouth daily.      . pantoprazole (PROTONIX) 40 MG tablet Take 40 mg by mouth daily.        No current facility-administered medications on file as of 02/08/2012.    No results found for this or any previous visit (from the past 48 hour(s)). No results found.  Review of Systems  Constitutional: Negative.   HENT: Negative.   Eyes: Negative.   Cardiovascular: Negative.   Gastrointestinal: Positive for heartburn. Negative for nausea, vomiting, abdominal pain, diarrhea, constipation, blood in stool and melena.  Musculoskeletal: Negative.   Skin: Negative.   Neurological: Negative.   Endo/Heme/Allergies: Negative.   Psychiatric/Behavioral: Positive for depression. Negative for suicidal ideas, hallucinations, memory loss and substance abuse. The patient is not nervous/anxious and does not have insomnia.     Blood pressure 134/84, pulse 87, temperature 97.9 F (36.6 C), height 5\' 2"  (1.575 m), weight 94.802 kg (209 lb), SpO2 95.00%. Physical Exam  Constitutional: She is oriented to person, place, and time. She appears well-developed and well-nourished.  HENT:  Head: Normocephalic and atraumatic.  Mouth/Throat: Oropharynx is clear and moist.  Eyes: Conjunctivae and EOM are normal. Pupils are equal, round, and reactive to light.  Neck: Normal range of motion. Neck supple.  Cardiovascular: Normal rate and regular rhythm.   Respiratory: Effort normal and breath sounds normal.  GI: Soft. Bowel sounds are normal.  Genitourinary:  Not pertinent to current symptomatology therefore not examined.   Musculoskeletal:       She's ambulating with a moderate antalgic gait. Left knee has well healed total knee incision. Right knee has active range of motion 0-90 degrees 2+ crepitus 2+ synovitis medial joint line tenderness. Distal neurovascular exam is intact.  Neurological: She is alert and oriented to person, place, and time.  Skin: Skin is warm and dry.  Psychiatric: She has a normal mood and affect. Her behavior is normal. Judgment and thought content normal.      Assessment Past Medical History  Diagnosis Date  . Arthritis   . Right knee DJD   . Left knee DJD 2004    left total knee replacement in 2004  . Depression   . Hypertension   . GERD (gastroesophageal reflux disease)     Plan The risks, benefits, and possible complications of the procedure were discussed in detail with the patient.  The patient is without question.  [name] J 02/08/2012, 4:17 PM

## 2012-02-14 NOTE — Pre-Procedure Instructions (Signed)
20 [name]  02/14/2012   Your procedure is scheduled on:  Monday 02/21/12    Report to [facility] at 730 AM.  Call this number if you have problems the morning of surgery: [phone]   Remember:   Do not eat food:After Midnight.  May have clear liquids: up to 4 Hours before arrival.  Clear liquids include soda, tea, black coffee, apple or grape juice, broth.  Take these medicines the morning of surgery with A SIP OF WATER: CELEXA, PROTONIX   Do not wear jewelry, make-up or nail polish.  Do not wear lotions, powders, or perfumes. You may wear deodorant.  Do not shave 48 hours prior to surgery.  Do not bring valuables to the hospital.  Contacts, dentures or bridgework may not be worn into surgery.  Leave suitcase in the car. After surgery it may be brought to your room.  For patients admitted to the hospital, checkout time is 11:00 AM the day of discharge.   Patients discharged the day of surgery will not be allowed to drive home.  Name and phone number of your driver:   Special Instructions: CHG Shower Use Special Wash: 1/2 bottle night before surgery and 1/2 bottle morning of surgery.   Please read over the following fact sheets that you were given: Pain Booklet, MRSA Information and Surgical Site Infection Prevention

## 2012-02-15 ENCOUNTER — Inpatient Hospital Stay (HOSPITAL_COMMUNITY): Admission: RE | Admit: 2012-02-15 | Discharge: 2012-02-15 | Payer: 59 | Source: Ambulatory Visit

## 2012-02-16 ENCOUNTER — Ambulatory Visit (HOSPITAL_COMMUNITY)
Admission: RE | Admit: 2012-02-16 | Discharge: 2012-02-16 | Disposition: A | Discharge status: Home / Self Care | Payer: 59 | Source: Ambulatory Visit | Attending: Physician Assistant | Admitting: Physician Assistant

## 2012-02-16 ENCOUNTER — Encounter (HOSPITAL_COMMUNITY): Payer: Self-pay

## 2012-02-16 ENCOUNTER — Encounter (HOSPITAL_COMMUNITY)
Admission: RE | Admit: 2012-02-16 | Discharge: 2012-02-16 | Disposition: A | Discharge status: Home / Self Care | Payer: 59 | Source: Ambulatory Visit | Attending: Orthopedic Surgery | Admitting: Orthopedic Surgery

## 2012-02-16 DIAGNOSIS — I10 Essential (primary) hypertension: Secondary | ICD-10-CM | POA: Insufficient documentation

## 2012-02-16 DIAGNOSIS — Z01818 Encounter for other preprocedural examination: Secondary | ICD-10-CM | POA: Insufficient documentation

## 2012-02-16 DIAGNOSIS — K449 Diaphragmatic hernia without obstruction or gangrene: Secondary | ICD-10-CM | POA: Insufficient documentation

## 2012-02-16 DIAGNOSIS — Z01812 Encounter for preprocedural laboratory examination: Secondary | ICD-10-CM | POA: Insufficient documentation

## 2012-02-16 HISTORY — DX: Anxiety disorder, unspecified: F41.9

## 2012-02-16 LAB — APTT: aPTT: 32 seconds (ref 24–37)

## 2012-02-16 LAB — COMPREHENSIVE METABOLIC PANEL
ALT: 12 U/L (ref 0–35)
Albumin: 4 g/dL (ref 3.5–5.2)
Alkaline Phosphatase: 113 U/L (ref 39–117)
Glucose, Bld: 97 mg/dL (ref 70–99)
Potassium: 3.7 mEq/L (ref 3.5–5.1)
Sodium: 142 mEq/L (ref 135–145)
Total Protein: 7.2 g/dL (ref 6.0–8.3)

## 2012-02-16 LAB — DIFFERENTIAL
Eosinophils Absolute: 0.2 10*3/uL (ref 0.0–0.7)
Eosinophils Relative: 3 % (ref 0–5)
Lymphs Abs: 2.2 10*3/uL (ref 0.7–4.0)
Monocytes Relative: 7 % (ref 3–12)

## 2012-02-16 LAB — TYPE AND SCREEN

## 2012-02-16 LAB — URINE MICROSCOPIC-ADD ON

## 2012-02-16 LAB — CBC
Hemoglobin: 14.4 g/dL (ref 12.0–15.0)
MCHC: 33.1 g/dL (ref 30.0–36.0)
RDW: 14.4 % (ref 11.5–15.5)

## 2012-02-16 LAB — URINALYSIS, ROUTINE W REFLEX MICROSCOPIC
Nitrite: NEGATIVE
Specific Gravity, Urine: 1.026 (ref 1.005–1.030)
pH: 5.5 (ref 5.0–8.0)

## 2012-02-16 LAB — SURGICAL PCR SCREEN: MRSA, PCR: NEGATIVE

## 2012-02-17 LAB — URINE CULTURE

## 2012-02-18 NOTE — Progress Notes (Signed)
Notified pt. Of time change. Instructed pt. To be here at 0700 on Monday 02/21/12.

## 2012-02-20 MED ORDER — CHLORHEXIDINE GLUCONATE 4 % EX LIQD: 60.0000 mL | Freq: Once | CUTANEOUS | Status: DC

## 2012-02-20 MED ORDER — CEFAZOLIN SODIUM-DEXTROSE 2-3 GM-% IV SOLR: 2.0000 g | INTRAVENOUS | Status: DC

## 2012-02-20 MED ORDER — POVIDONE-IODINE 7.5 % EX SOLN
Freq: Once | CUTANEOUS | Status: DC
  Filled 2012-02-20: qty 118

## 2012-02-21 ENCOUNTER — Ambulatory Visit (HOSPITAL_COMMUNITY): Admission: RE | Admit: 2012-02-21 | Payer: 59 | Source: Ambulatory Visit | Admitting: Orthopedic Surgery

## 2012-02-21 ENCOUNTER — Encounter (HOSPITAL_COMMUNITY): Admission: RE | Payer: Self-pay | Source: Ambulatory Visit

## 2012-02-21 SURGERY — ARTHROPLASTY, KNEE, TOTAL
Anesthesia: General | Laterality: Right

## 2012-03-30 ENCOUNTER — Encounter (HOSPITAL_COMMUNITY): Payer: Self-pay

## 2012-03-30 ENCOUNTER — Encounter (HOSPITAL_COMMUNITY): Payer: Self-pay | Admitting: Pharmacy Technician

## 2012-03-30 ENCOUNTER — Inpatient Hospital Stay (HOSPITAL_COMMUNITY): Admission: RE | Admit: 2012-03-30 | Discharge: 2012-03-30 | Payer: 59 | Source: Ambulatory Visit

## 2012-03-30 NOTE — Progress Notes (Signed)
Spoke with surgery scheduler they will put orders in once patient is seen in office for preop

## 2012-03-30 NOTE — Pre-Procedure Instructions (Signed)
20 [name]  03/30/2012   Your procedure is scheduled on: June 10  Report to [facility] at 05:30 AM.  Call this number if you have problems the morning of surgery: [phone]   Remember:   Do not eat food:After Midnight.  May have clear liquids: up to 4 Hours before arrival.  Clear liquids include soda, tea, black coffee, apple or grape juice, broth.  Take these medicines the morning of surgery with A SIP OF WATER: Flexeril, Protonix, Celexa     Do not wear jewelry, make-up or nail polish.  Do not wear lotions, powders, or perfumes. You may wear deodorant.  Do not shave 48 hours prior to surgery. Men may shave face and neck.  Do not bring valuables to the hospital.  Contacts, dentures or bridgework may not be worn into surgery.  Leave suitcase in the car. After surgery it may be brought to your room.  For patients admitted to the hospital, checkout time is 11:00 AM the day of discharge.   Special Instructions: Incentive Spirometry - Practice and bring it with you on the day of surgery. and CHG Shower Use Special Wash: 1/2 bottle night before surgery and 1/2 bottle morning of surgery.   Please read over the following fact sheets that you were given: Pain Booklet, Coughing and Deep Breathing, Blood Transfusion Information and Total Joint Packet

## 2012-04-04 ENCOUNTER — Other Ambulatory Visit: Payer: Self-pay | Admitting: Physician Assistant

## 2012-04-04 DIAGNOSIS — M1990 Unspecified osteoarthritis, unspecified site: Secondary | ICD-10-CM

## 2012-04-04 DIAGNOSIS — M1712 Unilateral primary osteoarthritis, left knee: Secondary | ICD-10-CM

## 2012-04-04 DIAGNOSIS — F419 Anxiety disorder, unspecified: Secondary | ICD-10-CM | POA: Insufficient documentation

## 2012-04-04 DIAGNOSIS — M1711 Unilateral primary osteoarthritis, right knee: Secondary | ICD-10-CM | POA: Insufficient documentation

## 2012-04-04 DIAGNOSIS — I10 Essential (primary) hypertension: Secondary | ICD-10-CM

## 2012-04-04 DIAGNOSIS — G589 Mononeuropathy, unspecified: Secondary | ICD-10-CM | POA: Insufficient documentation

## 2012-04-04 DIAGNOSIS — F329 Major depressive disorder, single episode, unspecified: Secondary | ICD-10-CM

## 2012-04-04 NOTE — H&P (Signed)
[name] is an 61 y.o. female.   Chief Complaint: right knee HPI:  [name] comes in today for evaluation of her right knee DJD.  She has progressively worsening right knee pain.  She has tried anti-inflammatories, intraarticular Cortisone injections, intraarticular Supartz injections and debriding arthroscopy.  All without success.  She comes in today with progressively worsening pain.  Past Medical History  Diagnosis Date  . Arthritis   . Right knee DJD   . Left knee DJD 2004    left total knee replacement in 2004  . Depression   . Hypertension   . GERD (gastroesophageal reflux disease)   . Anxiety   . Kidney stones   . Pinched nerve     Past Surgical History  Procedure Date  . Joint replacement 2004    left total knee replacement  . Ankle arthroscopy   . Tonsillectomy   . Appendectomy   . Right ring finger   . Carpal tunnel release     both wrists  . Shoulder arthroscopy w/ rotator cuff repair     left  . Nasal septum surgery   . R leg fracture   . Vaginal mesh   . Lithotripsy     x 2    Family History  Problem Relation Age of Onset  . Arthritis Mother   . Heart attack Mother   . Glaucoma Mother   . Anesthesia problems Mother   . Alcohol abuse Father   . Seizures Father   . Arthritis Brother   . Hypertension Brother    Social History:  reports that she quit smoking about 16 years ago. She has never used smokeless tobacco. She reports that she does not drink alcohol or use illicit drugs.  Allergies:  Allergies  Allergen Reactions  . Hydrocodone Itching  . Oxycodone Itching  . Sulfa Antibiotics Itching   Current Outpatient Prescriptions on File Prior to Visit  Medication Sig Dispense Refill  . celecoxib (CELEBREX) 200 MG capsule Take 200 mg by mouth daily.      . cetirizine (ZYRTEC) 10 MG tablet Take 10 mg by mouth daily.      . citalopram (CELEXA) 40 MG tablet Take 40 mg by mouth daily.      . cyclobenzaprine (FLEXERIL) 10 MG tablet Take 10 mg by mouth  3 (three) times daily as needed. For muscle spasms      . lisinopril-hydrochlorothiazide (PRINZIDE,ZESTORETIC) 10-12.5 MG per tablet Take 1 tablet by mouth daily.      . pantoprazole (PROTONIX) 40 MG tablet Take 40 mg by mouth daily.        (Not in a hospital admission)  No results found for this or any previous visit (from the past 48 hour(s)). No results found.  Review of Systems  Constitutional: Negative.   HENT: Negative.   Eyes: Negative.   Cardiovascular: Negative.   Gastrointestinal: Negative.   Genitourinary: Negative.   Musculoskeletal: Positive for joint pain.       Right knee pain  Skin: Negative.   Neurological: Negative.   Endo/Heme/Allergies:       Not pertinent to current symptomatology therefore not examined.  Psychiatric/Behavioral: Negative.     Blood pressure 149/94, pulse 89, temperature 98.5 F (36.9 C), height 5\' 3"  (1.6 m), weight 95.255 kg (210 lb), SpO2 96.00%. Physical Exam  Constitutional: She is oriented to person, place, and time. She appears well-developed and well-nourished.  HENT:  Head: Normocephalic and atraumatic.  Eyes: Pupils are equal, round, and reactive  to light.  Neck: Normal range of motion. Neck supple.  Cardiovascular: Normal rate and regular rhythm.   Respiratory: Effort normal.  GI: Soft.  Genitourinary:       Not pertinent to current symptomatology therefore not examined.  Musculoskeletal: She exhibits edema and tenderness.       She is independently ambulatory with a moderately antalgic gait.  She has active range of motion -10 to 110 degrees.  2+ crepitus.  1+ synovitis.  Distal neurovascular exam is intact.   Neurological: She is alert and oriented to person, place, and time.  Skin: Skin is warm.  Psychiatric: She has a normal mood and affect. Her behavior is normal. Thought content normal.     Assessment/ Patient Active Problem List  Diagnoses  . Arthritis  . Right knee DJD  . Left knee DJD  . Depression  .  Hypertension  . Anxiety  . Pinched nerve   PLAN: At this point in time we recommend a right total knee replacement.  Risks, benefits and possible complications have been discussed in detail.  She is without question.    [name] J 04/04/2012, 4:46 PM

## 2012-04-10 ENCOUNTER — Encounter (HOSPITAL_COMMUNITY)
Admission: RE | Admit: 2012-04-10 | Discharge: 2012-04-10 | Disposition: A | Discharge status: Home / Self Care | Payer: 59 | Source: Ambulatory Visit | Attending: Orthopedic Surgery | Admitting: Orthopedic Surgery

## 2012-04-10 LAB — DIFFERENTIAL
Basophils Relative: 0 % (ref 0–1)
Lymphocytes Relative: 34 % (ref 12–46)
Lymphs Abs: 3 10*3/uL (ref 0.7–4.0)
Monocytes Relative: 10 % (ref 3–12)
Neutro Abs: 4.8 10*3/uL (ref 1.7–7.7)
Neutrophils Relative %: 54 % (ref 43–77)

## 2012-04-10 LAB — COMPREHENSIVE METABOLIC PANEL
Albumin: 4 g/dL (ref 3.5–5.2)
BUN: 24 mg/dL — ABNORMAL HIGH (ref 6–23)
Calcium: 10.2 mg/dL (ref 8.4–10.5)
Creatinine, Ser: 0.84 mg/dL (ref 0.50–1.10)
Total Protein: 7.6 g/dL (ref 6.0–8.3)

## 2012-04-10 LAB — TYPE AND SCREEN
ABO/RH(D): O POS
Antibody Screen: NEGATIVE

## 2012-04-10 LAB — CBC
HCT: 44.9 % (ref 36.0–46.0)
MCHC: 33.4 g/dL (ref 30.0–36.0)
MCV: 85.2 fL (ref 78.0–100.0)
Platelets: 360 10*3/uL (ref 150–400)
RDW: 14.8 % (ref 11.5–15.5)

## 2012-04-10 LAB — URINALYSIS, ROUTINE W REFLEX MICROSCOPIC
Glucose, UA: NEGATIVE mg/dL
Ketones, ur: NEGATIVE mg/dL
Protein, ur: NEGATIVE mg/dL

## 2012-04-10 LAB — URINE MICROSCOPIC-ADD ON

## 2012-04-10 LAB — PROTIME-INR: INR: 1.04 (ref 0.00–1.49)

## 2012-04-10 LAB — SURGICAL PCR SCREEN: MRSA, PCR: NEGATIVE

## 2012-04-10 MED ORDER — CHLORHEXIDINE GLUCONATE 4 % EX LIQD: 60.0000 mL | Freq: Once | CUTANEOUS | Status: DC

## 2012-04-10 NOTE — Pre-Procedure Instructions (Signed)
20 SHIGEKO MANARD  04/10/2012   Your procedure is scheduled on:  June 10  Report to [facility] at 5:30 AM.  Call this number if you have problems the morning of surgery: [phone]   Remember:   Do not eat food:After Midnight.  May have clear liquids: up to 4 Hours before arrival. (1:30 AM)  Clear liquids include soda, tea, black coffee, apple or grape juice, broth.  Take these medicines the morning of surgery with A SIP OF WATER: zyrtec, celexa, protonix   Do not wear jewelry, make-up or nail polish.  Do not wear lotions, powders, or perfumes. You may wear deodorant.  Do not shave 48 hours prior to surgery. Men may shave face and neck.  Do not bring valuables to the hospital.  Contacts, dentures or bridgework may not be worn into surgery.  Leave suitcase in the car. After surgery it may be brought to your room.  For patients admitted to the hospital, checkout time is 11:00 AM the day of discharge.   Patients discharged the day of surgery will not be allowed to drive home.  Name and phone number of your driver:   Special Instructions: Incentive Spirometry - Practice and bring it with you on the day of surgery. and CHG Shower Use Special Wash: 1/2 bottle night before surgery and 1/2 bottle morning of surgery.   Please read over the following fact sheets that you were given: Pain Booklet, Coughing and Deep Breathing, Blood Transfusion Information, Total Joint Packet and Surgical Site Infection Prevention

## 2012-04-12 LAB — URINE CULTURE: Colony Count: 45000

## 2012-04-16 MED ORDER — POVIDONE-IODINE 7.5 % EX SOLN
Freq: Once | CUTANEOUS | Status: DC
  Filled 2012-04-16: qty 118

## 2012-04-16 MED ORDER — CEFAZOLIN SODIUM-DEXTROSE 2-3 GM-% IV SOLR
2.0000 g | INTRAVENOUS | Status: AC
  Administered 2012-04-17: 2 g via INTRAVENOUS
  Filled 2012-04-16: qty 50

## 2012-04-17 ENCOUNTER — Ambulatory Visit (HOSPITAL_COMMUNITY): Payer: 59 | Admitting: Certified Registered"

## 2012-04-17 ENCOUNTER — Encounter (HOSPITAL_COMMUNITY)
Admission: RE | Disposition: A | Discharge status: Home Health | Payer: Self-pay | Source: Ambulatory Visit | Attending: Orthopedic Surgery

## 2012-04-17 ENCOUNTER — Encounter (HOSPITAL_COMMUNITY): Payer: Self-pay | Admitting: Certified Registered"

## 2012-04-17 ENCOUNTER — Encounter (HOSPITAL_COMMUNITY): Payer: Self-pay | Admitting: *Deleted

## 2012-04-17 ENCOUNTER — Inpatient Hospital Stay (HOSPITAL_COMMUNITY)
Admission: RE | Admit: 2012-04-17 | Discharge: 2012-04-19 | DRG: 470 | Disposition: A | Discharge status: Home Health | Payer: 59 | Source: Ambulatory Visit | Attending: Orthopedic Surgery | Admitting: Orthopedic Surgery

## 2012-04-17 DIAGNOSIS — F419 Anxiety disorder, unspecified: Secondary | ICD-10-CM | POA: Insufficient documentation

## 2012-04-17 DIAGNOSIS — Z8249 Family history of ischemic heart disease and other diseases of the circulatory system: Secondary | ICD-10-CM

## 2012-04-17 DIAGNOSIS — M1990 Unspecified osteoarthritis, unspecified site: Secondary | ICD-10-CM | POA: Insufficient documentation

## 2012-04-17 DIAGNOSIS — F329 Major depressive disorder, single episode, unspecified: Secondary | ICD-10-CM | POA: Diagnosis present

## 2012-04-17 DIAGNOSIS — Z87442 Personal history of urinary calculi: Secondary | ICD-10-CM

## 2012-04-17 DIAGNOSIS — F411 Generalized anxiety disorder: Secondary | ICD-10-CM | POA: Diagnosis present

## 2012-04-17 DIAGNOSIS — F3289 Other specified depressive episodes: Secondary | ICD-10-CM | POA: Diagnosis present

## 2012-04-17 DIAGNOSIS — Z96659 Presence of unspecified artificial knee joint: Secondary | ICD-10-CM

## 2012-04-17 DIAGNOSIS — G589 Mononeuropathy, unspecified: Secondary | ICD-10-CM | POA: Insufficient documentation

## 2012-04-17 DIAGNOSIS — Z9089 Acquired absence of other organs: Secondary | ICD-10-CM

## 2012-04-17 DIAGNOSIS — I10 Essential (primary) hypertension: Secondary | ICD-10-CM | POA: Insufficient documentation

## 2012-04-17 DIAGNOSIS — F32A Depression, unspecified: Secondary | ICD-10-CM | POA: Insufficient documentation

## 2012-04-17 DIAGNOSIS — Z87891 Personal history of nicotine dependence: Secondary | ICD-10-CM

## 2012-04-17 DIAGNOSIS — K219 Gastro-esophageal reflux disease without esophagitis: Secondary | ICD-10-CM | POA: Diagnosis present

## 2012-04-17 DIAGNOSIS — M1710 Unilateral primary osteoarthritis, unspecified knee: Principal | ICD-10-CM | POA: Diagnosis present

## 2012-04-17 DIAGNOSIS — M1711 Unilateral primary osteoarthritis, right knee: Secondary | ICD-10-CM | POA: Insufficient documentation

## 2012-04-17 HISTORY — PX: TOTAL KNEE ARTHROPLASTY: SHX125

## 2012-04-17 SURGERY — ARTHROPLASTY, KNEE, TOTAL
Anesthesia: General | Site: Knee | Laterality: Right | Wound class: Clean

## 2012-04-17 MED ORDER — OXYCODONE HCL 5 MG PO TABS: 5.0000 mg | ORAL_TABLET | ORAL | Status: DC | PRN

## 2012-04-17 MED ORDER — MIDAZOLAM HCL 5 MG/5ML IJ SOLN
INTRAMUSCULAR | Status: DC | PRN
  Administered 2012-04-17: 2 mg via INTRAVENOUS

## 2012-04-17 MED ORDER — LIDOCAINE HCL (CARDIAC) 20 MG/ML IV SOLN
INTRAVENOUS | Status: DC | PRN
  Administered 2012-04-17: 100 mg via INTRAVENOUS

## 2012-04-17 MED ORDER — ONDANSETRON HCL 4 MG/2ML IJ SOLN: 4.0000 mg | Freq: Four times a day (QID) | INTRAMUSCULAR | Status: DC | PRN

## 2012-04-17 MED ORDER — PANTOPRAZOLE SODIUM 40 MG PO TBEC
40.0000 mg | DELAYED_RELEASE_TABLET | Freq: Every day | ORAL | Status: DC
  Administered 2012-04-17 – 2012-04-19 (×3): 40 mg via ORAL
  Filled 2012-04-17 (×3): qty 1

## 2012-04-17 MED ORDER — LORATADINE 10 MG PO TABS
10.0000 mg | ORAL_TABLET | Freq: Every day | ORAL | Status: DC
  Administered 2012-04-18 – 2012-04-19 (×2): 10 mg via ORAL
  Filled 2012-04-17 (×3): qty 1

## 2012-04-17 MED ORDER — HYDROMORPHONE HCL PF 1 MG/ML IJ SOLN
INTRAMUSCULAR | Status: AC
  Filled 2012-04-17: qty 1

## 2012-04-17 MED ORDER — ACETAMINOPHEN 650 MG RE SUPP: 650.0000 mg | Freq: Four times a day (QID) | RECTAL | Status: DC | PRN

## 2012-04-17 MED ORDER — BUPIVACAINE-EPINEPHRINE PF 0.5-1:200000 % IJ SOLN
INTRAMUSCULAR | Status: DC | PRN
  Administered 2012-04-17: 30 mL

## 2012-04-17 MED ORDER — DEXAMETHASONE SODIUM PHOSPHATE 10 MG/ML IJ SOLN
10.0000 mg | Freq: Once | INTRAMUSCULAR | Status: AC
  Administered 2012-04-17: 10 mg via INTRAVENOUS
  Filled 2012-04-17: qty 1

## 2012-04-17 MED ORDER — CYCLOBENZAPRINE HCL 10 MG PO TABS
10.0000 mg | ORAL_TABLET | Freq: Three times a day (TID) | ORAL | Status: DC | PRN
  Administered 2012-04-17 – 2012-04-18 (×2): 10 mg via ORAL
  Filled 2012-04-17 (×2): qty 1

## 2012-04-17 MED ORDER — ENOXAPARIN SODIUM 30 MG/0.3ML ~~LOC~~ SOLN
30.0000 mg | Freq: Two times a day (BID) | SUBCUTANEOUS | Status: DC
  Administered 2012-04-18 – 2012-04-19 (×3): 30 mg via SUBCUTANEOUS
  Filled 2012-04-17 (×5): qty 0.3

## 2012-04-17 MED ORDER — METOCLOPRAMIDE HCL 10 MG PO TABS: 5.0000 mg | ORAL_TABLET | Freq: Three times a day (TID) | ORAL | Status: DC | PRN

## 2012-04-17 MED ORDER — CEFAZOLIN SODIUM-DEXTROSE 2-3 GM-% IV SOLR
2.0000 g | Freq: Four times a day (QID) | INTRAVENOUS | Status: AC
  Administered 2012-04-17 (×2): 2 g via INTRAVENOUS
  Filled 2012-04-17 (×2): qty 50

## 2012-04-17 MED ORDER — SORBITOL 70 % SOLN
30.0000 mL | Freq: Two times a day (BID) | Status: DC
  Administered 2012-04-17 – 2012-04-18 (×2): 30 mL via ORAL
  Filled 2012-04-17 (×6): qty 30

## 2012-04-17 MED ORDER — FENTANYL CITRATE 0.05 MG/ML IJ SOLN
INTRAMUSCULAR | Status: DC | PRN
  Administered 2012-04-17: 100 ug via INTRAVENOUS
  Administered 2012-04-17: 50 ug via INTRAVENOUS
  Administered 2012-04-17: 100 ug via INTRAVENOUS

## 2012-04-17 MED ORDER — CELECOXIB 200 MG PO CAPS
200.0000 mg | ORAL_CAPSULE | Freq: Every day | ORAL | Status: DC
  Administered 2012-04-17 – 2012-04-19 (×3): 200 mg via ORAL
  Filled 2012-04-17 (×3): qty 1

## 2012-04-17 MED ORDER — TAPENTADOL HCL 50 MG PO TABS
50.0000 mg | ORAL_TABLET | ORAL | Status: DC | PRN
  Administered 2012-04-17 – 2012-04-19 (×4): 50 mg via ORAL
  Filled 2012-04-17 (×4): qty 1

## 2012-04-17 MED ORDER — METHOCARBAMOL 500 MG PO TABS
500.0000 mg | ORAL_TABLET | Freq: Four times a day (QID) | ORAL | Status: DC | PRN
  Administered 2012-04-17 – 2012-04-19 (×3): 500 mg via ORAL
  Filled 2012-04-17 (×4): qty 1

## 2012-04-17 MED ORDER — ONDANSETRON HCL 4 MG/2ML IJ SOLN: 4.0000 mg | Freq: Once | INTRAMUSCULAR | Status: DC | PRN

## 2012-04-17 MED ORDER — LACTATED RINGERS IV SOLN
INTRAVENOUS | Status: DC
  Administered 2012-04-17 (×2): via INTRAVENOUS

## 2012-04-17 MED ORDER — CITALOPRAM HYDROBROMIDE 40 MG PO TABS
40.0000 mg | ORAL_TABLET | Freq: Every day | ORAL | Status: DC
  Administered 2012-04-17 – 2012-04-19 (×3): 40 mg via ORAL
  Filled 2012-04-17 (×3): qty 1

## 2012-04-17 MED ORDER — MENTHOL 3 MG MT LOZG: 1.0000 | LOZENGE | OROMUCOSAL | Status: DC | PRN

## 2012-04-17 MED ORDER — ONDANSETRON HCL 4 MG PO TABS: 4.0000 mg | ORAL_TABLET | Freq: Four times a day (QID) | ORAL | Status: DC | PRN

## 2012-04-17 MED ORDER — METOCLOPRAMIDE HCL 5 MG/ML IJ SOLN: 5.0000 mg | Freq: Three times a day (TID) | INTRAMUSCULAR | Status: DC | PRN

## 2012-04-17 MED ORDER — DIPHENHYDRAMINE HCL 25 MG PO CAPS
25.0000 mg | ORAL_CAPSULE | Freq: Four times a day (QID) | ORAL | Status: DC | PRN
  Administered 2012-04-17 – 2012-04-18 (×2): 25 mg via ORAL
  Filled 2012-04-17 (×2): qty 1

## 2012-04-17 MED ORDER — DOCUSATE SODIUM 100 MG PO CAPS
100.0000 mg | ORAL_CAPSULE | Freq: Two times a day (BID) | ORAL | Status: DC
  Administered 2012-04-17 – 2012-04-18 (×3): 100 mg via ORAL
  Filled 2012-04-17 (×4): qty 1

## 2012-04-17 MED ORDER — ACETAMINOPHEN 325 MG PO TABS: 650.0000 mg | ORAL_TABLET | Freq: Four times a day (QID) | ORAL | Status: DC | PRN

## 2012-04-17 MED ORDER — HYDROMORPHONE HCL PF 1 MG/ML IJ SOLN
0.5000 mg | INTRAMUSCULAR | Status: DC | PRN
  Administered 2012-04-17 – 2012-04-19 (×3): 1 mg via INTRAVENOUS
  Filled 2012-04-17 (×4): qty 1

## 2012-04-17 MED ORDER — PROPOFOL 10 MG/ML IV EMUL
INTRAVENOUS | Status: DC | PRN
  Administered 2012-04-17: 130 mg via INTRAVENOUS

## 2012-04-17 MED ORDER — HYDROMORPHONE HCL PF 1 MG/ML IJ SOLN
0.2500 mg | INTRAMUSCULAR | Status: DC | PRN
  Administered 2012-04-17 (×4): 0.5 mg via INTRAVENOUS

## 2012-04-17 MED ORDER — METHOCARBAMOL 100 MG/ML IJ SOLN
500.0000 mg | Freq: Four times a day (QID) | INTRAVENOUS | Status: DC | PRN
  Administered 2012-04-17: 500 mg via INTRAVENOUS
  Filled 2012-04-17 (×2): qty 5

## 2012-04-17 MED ORDER — POTASSIUM CHLORIDE IN NACL 20-0.9 MEQ/L-% IV SOLN
INTRAVENOUS | Status: DC
  Administered 2012-04-17: 21:00:00 via INTRAVENOUS
  Filled 2012-04-17 (×7): qty 1000

## 2012-04-17 MED ORDER — PHENOL 1.4 % MT LIQD: 1.0000 | OROMUCOSAL | Status: DC | PRN

## 2012-04-17 MED ORDER — BUPIVACAINE-EPINEPHRINE PF 0.25-1:200000 % IJ SOLN
INTRAMUSCULAR | Status: DC | PRN
  Administered 2012-04-17: 30 mL

## 2012-04-17 MED ORDER — SODIUM CHLORIDE 0.9 % IR SOLN
Status: DC | PRN
  Administered 2012-04-17: 1000 mL

## 2012-04-17 MED ORDER — CEFUROXIME SODIUM 1.5 G IJ SOLR
INTRAMUSCULAR | Status: DC | PRN
  Administered 2012-04-17: 1.5 g

## 2012-04-17 SURGICAL SUPPLY — 71 items
BANDAGE ELASTIC 6 VELCRO ST LF (GAUZE/BANDAGES/DRESSINGS) ×1 IMPLANT
BANDAGE ESMARK 6X9 LF (GAUZE/BANDAGES/DRESSINGS) ×1 IMPLANT
BLADE SAGITTAL 25.0X1.19X90 (BLADE) ×2 IMPLANT
BLADE SAW SGTL 11.0X1.19X90.0M (BLADE) IMPLANT
BLADE SAW SGTL 13.0X1.19X90.0M (BLADE) ×2 IMPLANT
BLADE SURG 10 STRL SS (BLADE) ×4 IMPLANT
BNDG CMPR 9X6 STRL LF SNTH (GAUZE/BANDAGES/DRESSINGS) ×1
BNDG CMPR MED 15X6 ELC VLCR LF (GAUZE/BANDAGES/DRESSINGS) ×1
BNDG ELASTIC 6X15 VLCR STRL LF (GAUZE/BANDAGES/DRESSINGS) ×2 IMPLANT
BNDG ESMARK 6X9 LF (GAUZE/BANDAGES/DRESSINGS) ×2
BOWL SMART MIX CTS (DISPOSABLE) ×2 IMPLANT
CEMENT HV SMART SET (Cement) ×4 IMPLANT
CLOTH BEACON ORANGE TIMEOUT ST (SAFETY) ×2 IMPLANT
CLSR STERI-STRIP ANTIMIC 1/2X4 (GAUZE/BANDAGES/DRESSINGS) ×1 IMPLANT
COVER BACK TABLE 24X17X13 BIG (DRAPES) IMPLANT
COVER PROBE W GEL 5X96 (DRAPES) ×1 IMPLANT
COVER SURGICAL LIGHT HANDLE (MISCELLANEOUS) ×2 IMPLANT
CUFF TOURNIQUET SINGLE 34IN LL (TOURNIQUET CUFF) ×2 IMPLANT
CUFF TOURNIQUET SINGLE 44IN (TOURNIQUET CUFF) IMPLANT
DRAPE EXTREMITY T 121X128X90 (DRAPE) ×2 IMPLANT
DRAPE INCISE IOBAN 66X45 STRL (DRAPES) ×2 IMPLANT
DRAPE PROXIMA HALF (DRAPES) ×2 IMPLANT
DRAPE U-SHAPE 47X51 STRL (DRAPES) ×2 IMPLANT
DRSG ADAPTIC 3X8 NADH LF (GAUZE/BANDAGES/DRESSINGS) ×2 IMPLANT
DRSG PAD ABDOMINAL 8X10 ST (GAUZE/BANDAGES/DRESSINGS) ×3 IMPLANT
DURAPREP 26ML APPLICATOR (WOUND CARE) ×2 IMPLANT
ELECT CAUTERY BLADE 6.4 (BLADE) ×2 IMPLANT
ELECT REM PT RETURN 9FT ADLT (ELECTROSURGICAL) ×2
ELECTRODE REM PT RTRN 9FT ADLT (ELECTROSURGICAL) ×1 IMPLANT
EVACUATOR 1/8 PVC DRAIN (DRAIN) IMPLANT
FACESHIELD LNG OPTICON STERILE (SAFETY) ×2 IMPLANT
GLOVE BIO SURGEON STRL SZ7 (GLOVE) ×2 IMPLANT
GLOVE BIOGEL PI IND STRL 7.0 (GLOVE) ×1 IMPLANT
GLOVE BIOGEL PI IND STRL 7.5 (GLOVE) ×1 IMPLANT
GLOVE BIOGEL PI INDICATOR 7.0 (GLOVE) ×1
GLOVE BIOGEL PI INDICATOR 7.5 (GLOVE) ×1
GLOVE SS BIOGEL STRL SZ 7.5 (GLOVE) ×1 IMPLANT
GLOVE SUPERSENSE BIOGEL SZ 7.5 (GLOVE) ×1
GOWN PREVENTION PLUS XLARGE (GOWN DISPOSABLE) ×4 IMPLANT
GOWN STRL NON-REIN LRG LVL3 (GOWN DISPOSABLE) ×4 IMPLANT
HANDPIECE INTERPULSE COAX TIP (DISPOSABLE) ×2
HOOD PEEL AWAY FACE SHEILD DIS (HOOD) ×4 IMPLANT
IMMOBILIZER KNEE 22 UNIV (SOFTGOODS) ×1 IMPLANT
INSERT CUSHION PRONEVIEW LG (MISCELLANEOUS) ×2 IMPLANT
KIT BASIN OR (CUSTOM PROCEDURE TRAY) ×2 IMPLANT
KIT ROOM TURNOVER OR (KITS) ×2 IMPLANT
MANIFOLD NEPTUNE II (INSTRUMENTS) ×2 IMPLANT
NS IRRIG 1000ML POUR BTL (IV SOLUTION) ×2 IMPLANT
PACK TOTAL JOINT (CUSTOM PROCEDURE TRAY) ×2 IMPLANT
PAD ARMBOARD 7.5X6 YLW CONV (MISCELLANEOUS) ×4 IMPLANT
PAD CAST 4YDX4 CTTN HI CHSV (CAST SUPPLIES) ×1 IMPLANT
PADDING CAST COTTON 4X4 STRL (CAST SUPPLIES) ×2
PADDING CAST COTTON 6X4 STRL (CAST SUPPLIES) ×2 IMPLANT
POSITIONER HEAD PRONE TRACH (MISCELLANEOUS) ×2 IMPLANT
RUBBERBAND STERILE (MISCELLANEOUS) ×2 IMPLANT
SET HNDPC FAN SPRY TIP SCT (DISPOSABLE) ×1 IMPLANT
SPONGE GAUZE 4X4 12PLY (GAUZE/BANDAGES/DRESSINGS) ×2 IMPLANT
STRIP CLOSURE SKIN 1/2X4 (GAUZE/BANDAGES/DRESSINGS) ×2 IMPLANT
SUCTION FRAZIER TIP 10 FR DISP (SUCTIONS) ×2 IMPLANT
SUT MNCRL AB 3-0 PS2 18 (SUTURE) ×2 IMPLANT
SUT VIC AB 0 CT1 27 (SUTURE) ×6
SUT VIC AB 0 CT1 27XBRD ANBCTR (SUTURE) ×2 IMPLANT
SUT VIC AB 2-0 CT1 27 (SUTURE) ×4
SUT VIC AB 2-0 CT1 TAPERPNT 27 (SUTURE) ×2 IMPLANT
SUT VLOC 180 0 24IN GS25 (SUTURE) ×2 IMPLANT
SYR 30ML SLIP (SYRINGE) ×2 IMPLANT
TOWEL OR 17X24 6PK STRL BLUE (TOWEL DISPOSABLE) ×2 IMPLANT
TOWEL OR 17X26 10 PK STRL BLUE (TOWEL DISPOSABLE) ×2 IMPLANT
TRAY FOLEY CATH 14FR (SET/KITS/TRAYS/PACK) ×2 IMPLANT
TUBE CONNECTING 12X1/4 (SUCTIONS) ×1 IMPLANT
WATER STERILE IRR 1000ML POUR (IV SOLUTION) ×6 IMPLANT

## 2012-04-17 NOTE — Preoperative (Signed)
Beta Blockers   Reason not to administer Beta Blockers:Not Applicable 

## 2012-04-17 NOTE — Brief Op Note (Signed)
04/17/2012  8:58 AM  PATIENT:  Felicity Pellegrini  61 y.o. female  PRE-OPERATIVE DIAGNOSIS:  END STAGE DJD RIGHT KNEE  POST-OPERATIVE DIAGNOSIS:  END STAGE DJD RIGHT KNEE  PROCEDURE:  Procedure(s) (LRB): TOTAL KNEE ARTHROPLASTY (Right)  SURGEON:  Surgeon(s) and Role:    * Nilda Simmer, MD - Primary  PHYSICIAN ASSISTANT: Kirstin Shepperson PA  ASSISTANTS:Kirstin Shepperson PA-C  ANESTHESIA:   general  EBL:  Total I/O In: 1000 [I.V.:1000] Out: -   BLOOD ADMINISTERED:none  DRAINS: 2 drains in right knee   clamped  LOCAL MEDICATIONS USED:  NONE  SPECIMEN:  none  DISPOSITION OF SPECIMEN:  N/A  COUNTS:  YES  TOURNIQUET:  * Missing tourniquet times found for documented tourniquets in log:  [id number] *  DICTATION: note written in epic  PLAN OF CARE: Admit to inpatient   PATIENT DISPOSITION:  PACU - hemodynamically stable.   Delay start of Pharmacological VTE agent (>24hrs) due to surgical blood loss or risk of bleeding: no

## 2012-04-17 NOTE — Op Note (Signed)
MRN:     [id number] DOB/AGE:    61-20-1952 / 61 y.o.       OPERATIVE REPORT    DATE OF PROCEDURE:  04/17/2012       PREOPERATIVE DIAGNOSIS:   END STAGE DJD RIGHT KNEE      Estimated Body mass index is 39.26 kg/(m^2) as calculated from the following:   Height as of 02/16/12: 5\' 1" (1.549 m).   Weight as of 02/16/12: 207 lb 12.8 oz(94.257 kg).                                                        POSTOPERATIVE DIAGNOSIS:   END STAGE DJD RIGHT KNEE                                                                      PROCEDURE:  Procedure(s): TOTAL KNEE ARTHROPLASTY Using Depuy Sigma RP implants #3 Femur, #4Tibia, 12.65mm sigma RP bearing, 32 Patella     SURGEON: Henrick Mcgue A    ASSISTANT:  Kirstin Shepperson PA-C   (Present and scrubbed throughout the case, critical for assistance with exposure, retraction, instrumentation, and closure.)         ANESTHESIA: GET with Femoral Nerve Block  DRAINS: foley, 2 medium hemovac in knee   TOURNIQUET TIME:   COMPLICATIONS:  None     SPECIMENS: None   INDICATIONS FOR PROCEDURE: The patient has  END STAGE DJD RIGHT KNEE, varus deformities, XR shows bone on bone arthritis. Patient has failed all conservative measures including anti-inflammatory medicines, narcotics, attempts at  exercise and weight loss, cortisone injections and viscosupplementation.  Risks and benefits of surgery have been discussed, questions answered.   DESCRIPTION OF PROCEDURE: The patient identified by armband, received  right femoral nerve block and IV antibiotics, in the holding area at [facility]. Patient taken to the operating room, appropriate anesthetic  monitors were attached General endotracheal anesthesia induced with  the patient in supine position, Foley catheter was inserted. Tourniquet  applied high to the operative thigh. Lateral post and foot positioner  applied to the table, the lower extremity was then prepped and draped  in usual sterile  fashion from the ankle to the tourniquet. Time-out procedure was performed. The limb was wrapped with an Esmarch bandage and the tourniquet inflated to 365 mmHg. We began the operation by making the anterior midline incision starting at handbreadth above the patella going over the patella 1 cm medial to and  4 cm distal to the tibial tubercle. Small bleeders in the skin and the  subcutaneous tissue identified and cauterized. Transverse retinaculum was incised and reflected medially and a medial parapatellar arthrotomy was accomplished. the patella was everted and theprepatellar fat pad resected. The superficial medial collateral  ligament was then elevated from anterior to posterior along the proximal  flare of the tibia and anterior half of the menisci resected. The knee was hyperflexed exposing bone on bone arthritis. Peripheral and notch osteophytes as well as the cruciate ligaments were then resected. We continued to  work our way around posteriorly along the  proximal tibia, and externally  rotated the tibia subluxing it out from underneath the femur. A McHale  retractor was placed through the notch and a lateral Hohmann retractor  placed, and we then drilled through the proximal tibia in line with the  axis of the tibia followed by an intramedullary guide rod and 2-degree  posterior slope cutting guide. The tibial cutting guide was pinned into place  allowing resection of 4 mm of bone medially and about 8 mm of bone  laterally because of her varus deformity. Satisfied with the tibial resection, we then  entered the distal femur 2 mm anterior to the PCL origin with the  intramedullary guide rod and applied the distal femoral cutting guide  set at 11mm, with 5 degrees of valgus. This was pinned along the  epicondylar axis. At this point, the distal femoral cut was accomplished without difficulty. We then sized for a #3 femoral component and pinned the guide in 3 degrees of external rotation.The  chamfer cutting guide was pinned into place. The anterior, posterior, and chamfer cuts were accomplished without difficulty followed by  the Sigma RP box cutting guide and the box cut. We also removed posterior osteophytes from the posterior femoral condyles. At this  time, the knee was brought into full extension. We checked our  extension and flexion gaps and found them symmetric at 12.74mm.  The patella thickness measured at 22 mm. We set the cutting guide at 14 and removed the posterior 8.5 mm  of the patella sized for 32 button and drilled the lollipop. The knee  was then once again hyperflexed exposing the proximal tibia. We sized for a #4 tibial base plate, applied the smokestack and the conical reamer followed by the the Delta fin keel punch. We then hammered into place the Sigma RP trial femoral component, inserted a 12.5-mm trial bearing, trial patellar button, and took the knee through range of motion from 0-130 degrees. No thumb pressure was required for patellar  tracking. At this point, all trial components were removed, a double batch of DePuy HV cement with 1500 mg of Zinacef was mixed and applied to all bony metallic mating surfaces except for the posterior condyles of the femur itself. In order, we  hammered into place the tibial tray and removed excess cement, the femoral component and removed excess cement, a 12.5-mm Sigma RP bearing  was inserted, and the knee brought to full extension with compression.  The patellar button was clamped into place, and excess cement  removed. While the cement cured the wound was irrigated out with normal saline solution pulse lavage, and medium Hemovac drains were placed.. Ligament stability and patellar tracking were checked and found to be excellent. The tourniquet was then released and hemostasis was obtained with cautery. The parapatellar arthrotomy was closed with  #2 Z lock. The subcutaneous tissue with 0 and 2-0 undyed  Vicryl suture, and 4-0  Monocryl.. A dressing of Xeroform,  4 x 4, dressing sponges, Webril, and Ace wrap applied. Needle and sponge count were correct times 2.The patient awakened, extubated, and taken to recovery room without difficulty. Vascular status was normal, pulses 2+ and symmetric.   Worley Radermacher A 04/17/2012, 10:52 AM

## 2012-04-17 NOTE — Anesthesia Postprocedure Evaluation (Signed)
Anesthesia Post Note  Patient: [name]  Procedure(s) Performed: Procedure(s) (LRB): TOTAL KNEE ARTHROPLASTY (Right)  Anesthesia type: general  Patient location: PACU  Post pain: Pain level controlled  Post assessment: Patient's Cardiovascular Status Stable  Last Vitals:  Filed Vitals:   04/17/12 1015  BP: 130/63  Pulse: 107  Temp:   Resp: 19    Post vital signs: Reviewed and stable  Level of consciousness: sedated  Complications: No apparent anesthesia complications

## 2012-04-17 NOTE — Progress Notes (Signed)
Orthopedic Tech Progress Note Patient Details:  [name] 1951/04/11 [id number]  CPM Right Knee CPM Right Knee: On Right Knee Flexion (Degrees): 60  Right Knee Extension (Degrees): 0  Additional Comments: trapeze bar   Cammer, Mickie Bail 04/17/2012, 10:18 AM

## 2012-04-17 NOTE — Transfer of Care (Signed)
Immediate Anesthesia Transfer of Care Note  Patient: [name]  Procedure(s) Performed: Procedure(s) (LRB): TOTAL KNEE ARTHROPLASTY (Right)  Patient Location: PACU  Anesthesia Type: GA combined with regional for post-op pain  Level of Consciousness: awake, alert  and oriented  Airway & Oxygen Therapy: Patient Spontanous Breathing and Patient connected to nasal cannula oxygen  Post-op Assessment: Report given to PACU RN, Post -op Vital signs reviewed and stable and Patient moving all extremities  Post vital signs: Reviewed and stable  Complications: No apparent anesthesia complications

## 2012-04-17 NOTE — Interval H&P Note (Signed)
History and Physical Interval Note:  04/17/2012 6:55 AM  [name]  has presented today for surgery, with the diagnosis of DJD RIGHT KNEE  The various methods of treatment have been discussed with the patient and family. After consideration of risks, benefits and other options for treatment, the patient has consented to  Procedure(s) (LRB): TOTAL KNEE ARTHROPLASTY (Right) as a surgical intervention .  The patients' history has been reviewed, patient examined, no change in status, stable for surgery.  I have reviewed the patients' chart and labs.  Questions were answered to the patient's satisfaction.     Salvatore Marvel A

## 2012-04-17 NOTE — Anesthesia Preprocedure Evaluation (Addendum)
Anesthesia Evaluation  Patient identified by MRN, date of birth, ID band Patient awake    Reviewed: Allergy & Precautions, H&P , NPO status , Patient's Chart, lab work & pertinent test results  Airway Mallampati: II TM Distance: >3 FB Neck ROM: Full    Dental  (+) Teeth Intact   Pulmonary          Cardiovascular hypertension, Pt. on medications     Neuro/Psych Anxiety Depression    GI/Hepatic GERD-  Medicated,  Endo/Other    Renal/GU      Musculoskeletal   Abdominal   Peds  Hematology   Anesthesia Other Findings   Reproductive/Obstetrics                           Anesthesia Physical Anesthesia Plan  ASA: II  Anesthesia Plan: General   Post-op Pain Management: MAC Combined w/ Regional for Post-op pain   Induction: Intravenous  Airway Management Planned: LMA  Additional Equipment:   Intra-op Plan:   Post-operative Plan: Extubation in OR  Informed Consent: I have reviewed the patients History and Physical, chart, labs and discussed the procedure including the risks, benefits and alternatives for the proposed anesthesia with the patient or authorized representative who has indicated his/her understanding and acceptance.   Dental advisory given  Plan Discussed with: CRNA  Anesthesia Plan Comments:        Anesthesia Quick Evaluation

## 2012-04-17 NOTE — H&P (View-Only) (Signed)
[name] is an 61 y.o. female.   Chief Complaint: right knee HPI:  [name] comes in today for evaluation of her right knee DJD.  She has progressively worsening right knee pain.  She has tried anti-inflammatories, intraarticular Cortisone injections, intraarticular Supartz injections and debriding arthroscopy.  All without success.  She comes in today with progressively worsening pain.  Past Medical History  Diagnosis Date  . Arthritis   . Right knee DJD   . Left knee DJD 2004    left total knee replacement in 2004  . Depression   . Hypertension   . GERD (gastroesophageal reflux disease)   . Anxiety   . Kidney stones   . Pinched nerve     Past Surgical History  Procedure Date  . Joint replacement 2004    left total knee replacement  . Ankle arthroscopy   . Tonsillectomy   . Appendectomy   . Right ring finger   . Carpal tunnel release     both wrists  . Shoulder arthroscopy w/ rotator cuff repair     left  . Nasal septum surgery   . R leg fracture   . Vaginal mesh   . Lithotripsy     x 2    Family History  Problem Relation Age of Onset  . Arthritis Mother   . Heart attack Mother   . Glaucoma Mother   . Anesthesia problems Mother   . Alcohol abuse Father   . Seizures Father   . Arthritis Brother   . Hypertension Brother    Social History:  reports that she quit smoking about 16 years ago. She has never used smokeless tobacco. She reports that she does not drink alcohol or use illicit drugs.  Allergies:  Allergies  Allergen Reactions  . Hydrocodone Itching  . Oxycodone Itching  . Sulfa Antibiotics Itching   Current Outpatient Prescriptions on File Prior to Visit  Medication Sig Dispense Refill  . celecoxib (CELEBREX) 200 MG capsule Take 200 mg by mouth daily.      . cetirizine (ZYRTEC) 10 MG tablet Take 10 mg by mouth daily.      . citalopram (CELEXA) 40 MG tablet Take 40 mg by mouth daily.      . cyclobenzaprine (FLEXERIL) 10 MG tablet Take 10 mg by mouth  3 (three) times daily as needed. For muscle spasms      . lisinopril-hydrochlorothiazide (PRINZIDE,ZESTORETIC) 10-12.5 MG per tablet Take 1 tablet by mouth daily.      . pantoprazole (PROTONIX) 40 MG tablet Take 40 mg by mouth daily.        (Not in a hospital admission)  No results found for this or any previous visit (from the past 48 hour(s)). No results found.  Review of Systems  Constitutional: Negative.   HENT: Negative.   Eyes: Negative.   Cardiovascular: Negative.   Gastrointestinal: Negative.   Genitourinary: Negative.   Musculoskeletal: Positive for joint pain.       Right knee pain  Skin: Negative.   Neurological: Negative.   Endo/Heme/Allergies:       Not pertinent to current symptomatology therefore not examined.  Psychiatric/Behavioral: Negative.     Blood pressure 149/94, pulse 89, temperature 98.5 F (36.9 C), height 5' 3" (1.6 m), weight 95.255 kg (210 lb), SpO2 96.00%. Physical Exam  Constitutional: She is oriented to person, place, and time. She appears well-developed and well-nourished.  HENT:  Head: Normocephalic and atraumatic.  Eyes: Pupils are equal, round, and reactive   to light.  Neck: Normal range of motion. Neck supple.  Cardiovascular: Normal rate and regular rhythm.   Respiratory: Effort normal.  GI: Soft.  Genitourinary:       Not pertinent to current symptomatology therefore not examined.  Musculoskeletal: She exhibits edema and tenderness.       She is independently ambulatory with a moderately antalgic gait.  She has active range of motion -10 to 110 degrees.  2+ crepitus.  1+ synovitis.  Distal neurovascular exam is intact.   Neurological: She is alert and oriented to person, place, and time.  Skin: Skin is warm.  Psychiatric: She has a normal mood and affect. Her behavior is normal. Thought content normal.     Assessment/ Patient Active Problem List  Diagnoses  . Arthritis  . Right knee DJD  . Left knee DJD  . Depression  .  Hypertension  . Anxiety  . Pinched nerve   PLAN: At this point in time we recommend a right total knee replacement.  Risks, benefits and possible complications have been discussed in detail.  She is without question.    Carnisha Feltz J 04/04/2012, 4:46 PM    

## 2012-04-17 NOTE — Anesthesia Procedure Notes (Signed)
Anesthesia Regional Block:  Femoral nerve block  Pre-Anesthetic Checklist: ,, timeout performed, Correct Patient, Correct Site, Correct Laterality, Correct Procedure, Correct Position, site marked, Risks and benefits discussed,  Surgical consent,  Pre-op evaluation,  At surgeon's request and post-op pain management  Laterality: Right  Prep: chloraprep       Needles:  Injection technique: Single-shot  Needle Type: Echogenic Stimulator Needle     Needle Length:cm 9 cm Needle Gauge: 21 G    Additional Needles:  Procedures: ultrasound guided and nerve stimulator Femoral nerve block  Nerve Stimulator or Paresthesia:  Response: 0.4 mA,   Additional Responses:   Narrative:  Start time: 04/17/2012 7:05 AM End time: 04/17/2012 7:20 AM Injection made incrementally with aspirations every 5 mL.  Performed by: Personally  Anesthesiologist: Arta Bruce MD  Additional Notes: Monitors applied. Patient sedated. Sterile prep and drape,hand hygiene and sterile gloves were used. Relevant anatomy identified.Needle position confirmed.Local anesthetic injected incrementally after negative aspiration. Local anesthetic spread visualized around nerve(s). Vascular puncture avoided. No complications. Image printed for medical record.The patient tolerated the procedure well.       Femoral nerve block

## 2012-04-18 ENCOUNTER — Encounter (HOSPITAL_COMMUNITY): Payer: Self-pay | Admitting: Orthopedic Surgery

## 2012-04-18 LAB — CBC
HCT: 33.7 % — ABNORMAL LOW (ref 36.0–46.0)
MCH: 28.6 pg (ref 26.0–34.0)
MCV: 86.2 fL (ref 78.0–100.0)
Platelets: 251 10*3/uL (ref 150–400)
RBC: 3.91 MIL/uL (ref 3.87–5.11)

## 2012-04-18 LAB — URINE CULTURE
Colony Count: NO GROWTH
Culture  Setup Time: 201306100751
Culture: NO GROWTH

## 2012-04-18 LAB — BASIC METABOLIC PANEL
BUN: 18 mg/dL (ref 6–23)
CO2: 29 mEq/L (ref 19–32)
Calcium: 8.8 mg/dL (ref 8.4–10.5)
Creatinine, Ser: 0.7 mg/dL (ref 0.50–1.10)

## 2012-04-18 MED ORDER — SODIUM CHLORIDE 0.9 % IV BOLUS (SEPSIS)
500.0000 mL | Freq: Once | INTRAVENOUS | Status: AC
  Administered 2012-04-18: 500 mL via INTRAVENOUS

## 2012-04-18 NOTE — Discharge Instructions (Signed)
Home Health Physical Therapy to be provided by [facility] [phone] °

## 2012-04-18 NOTE — Evaluation (Signed)
Agree with evaluation and goals.  04/18/2012 Stelios Kirby M [place] Latulippe, PT, DPT [phone]   

## 2012-04-18 NOTE — Progress Notes (Signed)
Physical Therapy Treatment Note   04/18/12 1119  PT Visit Information  Last PT Received On 04/18/12  Assistance Needed +1  PT Time Calculation  PT Start Time 1119  PT Stop Time 1149  PT Time Calculation (min) 30 min  Subjective Data  Subjective Pt received sitting up in chair with request to put on bra. Patient with report of 3/10 R knee pain at rest, 10/10 R knee pain with mvmt.  Precautions  Precautions Knee  Precaution Booklet Issued No  Required Braces or Orthoses Knee Immobilizer - Right  Knee Immobilizer - Right Discontinue post op day 2  Restrictions  RLE Weight Bearing WBAT  Cognition  Overall Cognitive Status Appears within functional limits for tasks assessed/performed  Arousal/Alertness Awake/alert  Orientation Level Appears intact for tasks assessed  Behavior During Session [facility] for tasks performed  Transfers  Transfers Sit to Stand;Stand to Sit  Sit to Stand 4: Min assist;With armrests;From chair/3-in-1;With upper extremity assist  Stand to Sit 4: Min assist;With upper extremity assist;To chair/3-in-1  Details for Transfer Assistance pt demo'd good technique however required vc's to slow down for safety  Ambulation/Gait  Ambulation/Gait Assistance 4: Min guard  Ambulation Distance (Feet) 225 Feet  Assistive device Rolling walker  Ambulation/Gait Assistance Details pt with increased bilat UE wbing,  R KI used, pt reports decreased pain in R knee than expected  Gait Pattern Step-to pattern;Decreased step length - right;Decreased stance time - right;Antalgic  Gait velocity wfl  Stairs No  Wheelchair Mobility  Wheelchair Mobility No  Exercises  Exercises Total Joint  Total Joint Exercises  Quad Sets AROM;Right;10 reps;Supine  Heel Slides AROM;Right;20 reps;Seated  Short Arc Quad AAROM;Right;10 reps;Seated (with LE elevated, pt with minimal contraction)  Long Arc Quad PROM (attempted, pt unable to complete)  PT - End of Session  Equipment Utilized During  Treatment Gait belt  Activity Tolerance Patient tolerated treatment well  Patient left in chair;with call bell/phone within reach;with family/visitor present  Nurse Communication Mobility status  PT - Assessment/Plan  Comments on Treatment Session Pt progressing very well. Pt independent with doning bra. Pt remains to have minimal active R quad set limiting ability to progress ther ex. Patient tolerating ambulation very well  PT Plan Discharge plan remains appropriate;Frequency remains appropriate  PT Frequency 7X/week  Follow Up Recommendations Home health PT;Supervision - Intermittent  Equipment Recommended None recommended by PT  Acute Rehab PT Goals  Time For Goal Achievement 04/25/12  Potential to Achieve Goals Good  PT Goal: Sit to Stand - Progress Progressing toward goal  PT Goal: Stand to Sit - Progress Progressing toward goal  PT Goal: Ambulate - Progress Progressing toward goal  PT Goal: Perform Home Exercise Program - Progress Progressing toward goal  PT General Charges  $$ ACUTE PT VISIT 1 Procedure  PT Treatments  $Gait Training 8-22 mins  $Therapeutic Exercise 8-22 mins     Pain: 10/10 pain initially in R knee, 7/10 in R knee post PT  Lewis Shock, PT, DPT Pager #: [phone] Office #: [phone]

## 2012-04-18 NOTE — Evaluation (Signed)
Physical Therapy Evaluation Patient Details Name: [name] MRN: [id number] DOB: [birth year] Today's Date: 04/18/2012 Time: 4098-1191 PT Time Calculation (min): 33 min  PT Assessment / Plan / Recommendation Clinical Impression  Pt is a 61 y.o. female admitted s/p R TKA along with below listed problem list. Pt would benefit from PT services to facilitate a safe D/C home with HHPT.     PT Assessment  Patient needs continued PT services    Follow Up Recommendations  Home health PT    Barriers to Discharge None      lEquipment Recommendations  None recommended by PT    Recommendations for Other Services     Frequency 7X/week    Precautions / Restrictions Precautions Precautions: Knee Precaution Booklet Issued: No Required Braces or Orthoses: Knee Immobilizer - Right Knee Immobilizer - Right: Discontinue post op day 2 Restrictions Weight Bearing Restrictions: Yes RLE Weight Bearing: Weight bearing as tolerated   Pertinent Vitals/Pain N/A      Mobility  Bed Mobility Bed Mobility: Supine to Sit Supine to Sit: 4: Min assist Details for Bed Mobility Assistance: Assist to bring R LE to EOB. Cues for sequence and safe R LE placement.  Transfers Transfers: Sit to Stand;Stand to Sit Sit to Stand: 4: Min assist Stand to Sit: 4: Min assist Details for Transfer Assistance: Assist for balance and to bring weight over BOS. Cues for sequence and safe hand placement.  Ambulation/Gait Ambulation/Gait Assistance: 4: Min assist Ambulation Distance (Feet): 225 Feet Assistive device: Rolling walker Ambulation/Gait Assistance Details: Assist for balance. Cues for tall posture and step through gait pattern.  Gait Pattern: Step-through pattern;Decreased step length - right;Decreased stance time - right Stairs: No Wheelchair Mobility Wheelchair Mobility: No    Exercises Total Joint Exercises Ankle Circles/Pumps: AROM;Right;10 reps;Supine Quad Sets: AROM;Right;10 reps;Supine Heel  Slides: AAROM;Right;10 reps;Supine   PT Diagnosis: Acute pain;Difficulty walking  PT Problem List: Decreased strength;Decreased range of motion;Decreased activity tolerance;Decreased mobility;Decreased knowledge of use of DME;Pain PT Treatment Interventions: DME instruction;Gait training;Stair training;Functional mobility training;Therapeutic activities;Therapeutic exercise;Patient/family education   PT Goals Acute Rehab PT Goals PT Goal Formulation: With patient Time For Goal Achievement: 04/25/12 Potential to Achieve Goals: Good Pt will go Supine/Side to Sit: with modified independence PT Goal: Supine/Side to Sit - Progress: Goal set today Pt will go Sit to Supine/Side: with modified independence PT Goal: Sit to Supine/Side - Progress: Goal set today Pt will go Sit to Stand: with modified independence PT Goal: Sit to Stand - Progress: Goal set today Pt will go Stand to Sit: with modified independence PT Goal: Stand to Sit - Progress: Goal set today Pt will Ambulate: >150 feet;with modified independence;with least restrictive assistive device PT Goal: Ambulate - Progress: Goal set today Pt will Go Up / Down Stairs: 3-5 stairs;with modified independence;with least restrictive assistive device PT Goal: Up/Down Stairs - Progress: Goal set today Pt will Perform Home Exercise Program: Independently PT Goal: Perform Home Exercise Program - Progress: Goal set today  Visit Information  Last PT Received On: 04/18/12 Assistance Needed: +1    Subjective Data  Subjective: "I'm not in any pain." Patient Stated Goal: Go home.    Prior Functioning  Home Living Lives With: Spouse Available Help at Discharge: Family Type of Home: House Home Access: Stairs to enter Secretary/administrator of Steps: 3 Entrance Stairs-Rails: Right Home Layout: One level Bathroom Shower/Tub: Forensic scientist: Standard Bathroom Accessibility: Yes Home Adaptive Equipment: Walker -  rolling;Shower chair with back Prior Function  Level of Independence: Independent with assistive device(s) (Cane) Able to Take Stairs?: Yes Driving: Yes Vocation: Retired Musician: No difficulties Dominant Hand: Right    Cognition  Overall Cognitive Status: Appears within functional limits for tasks assessed/performed Arousal/Alertness: Awake/alert Orientation Level: Appears intact for tasks assessed Behavior During Session: [facility] for tasks performed    Extremity/Trunk Assessment Right Upper Extremity Assessment RUE ROM/Strength/Tone: Within functional levels RUE Sensation: WFL - Light Touch RUE Coordination: WFL - gross/fine motor Left Upper Extremity Assessment LUE ROM/Strength/Tone: Within functional levels LUE Sensation: WFL - Light Touch LUE Coordination: WFL - gross/fine motor Right Lower Extremity Assessment RLE ROM/Strength/Tone: Deficits RLE ROM/Strength/Tone Deficits: R KNEE A/AAROM 0-79 degrees; 3/5 throughout RLE Sensation: WFL - Light Touch RLE Coordination: WFL - gross/fine motor Left Lower Extremity Assessment LLE ROM/Strength/Tone: Within functional levels LLE Sensation: WFL - Light Touch LLE Coordination: WFL - gross/fine motor Trunk Assessment Trunk Assessment: Normal   Balance Balance Balance Assessed: No  End of Session PT - End of Session Equipment Utilized During Treatment: Gait belt;Right knee immobilizer Activity Tolerance: Patient tolerated treatment well Patient left: in chair;with call bell/phone within reach Nurse Communication: Mobility status;Patient requests pain meds CPM Right Knee CPM Right Knee: Off   Oretha Ellis 04/18/2012, 1:16 PM

## 2012-04-18 NOTE — Progress Notes (Signed)
Patient ID: [name], female   DOB: 10/01/1951, 61 y.o.   MRN: [id number] PATIENT ID: [name]  MRN: [id number]  DOB/AGE:  17-Dec-1950 / 61 y.o.  1 Day Post-Op Procedure(s) (LRB): TOTAL KNEE ARTHROPLASTY (Right)    PROGRESS NOTE Subjective: Patient is alert, oriented, no Nausea, no Vomiting, yes passing gas, no Bowel Movement. Taking PO yes. Denies SOB, Chest or Calf Pain. Using Incentive Spirometer, PAS in place. Ambulate not yet, CPM 0-90 Patient reports pain as 4 on 0-10 scale  .    Objective: Vital signs in last 24 hours: Filed Vitals:   04/17/12 1600 04/17/12 2304 04/18/12 0143 04/18/12 0621  BP:  104/80 110/76 115/70  Pulse:  91 89 87  Temp:  98.7 F (37.1 C) 98.6 F (37 C) 98.7 F (37.1 C)  TempSrc:      Resp: 16 20 20 20   SpO2:  94% 95% 93%      Intake/Output from previous day: I/O last 3 completed shifts: In: 3955 [P.O.:1200; I.V.:2700; IV Piggyback:55] Out: 1300 [Urine:1100; Drains:150; Blood:50]   Intake/Output this shift:     LABORATORY DATA:  Basename 04/18/12 0500  WBC 10.8*  HGB 11.2*  HCT 33.7*  PLT 251  NA 140  K 4.3  CL 105  CO2 29  BUN 18  CREATININE 0.70  GLUCOSE 138*  GLUCAP --  INR --  CALCIUM 8.8    Examination: ABD soft Neurovascular intact Sensation intact distally Intact pulses distally Dorsiflexion/Plantar flexion intact Incision: dressing C/D/I}  Assessment:   1 Day Post-Op Procedure(s) (LRB): TOTAL KNEE ARTHROPLASTY (Right) ADDITIONAL DIAGNOSIS:  Acute Blood Loss Anemia Patient Active Problem List  Diagnoses  . Arthritis  . Right knee DJD  . Left knee DJD  . Depression  . Hypertension  . Anxiety  . Pinched nerve   Plan: PT/OT WBAT, CPM 5/hrs day until ROM 0-90 degrees, then D/C CPM DVT Prophylaxis:  SCDx72hrs, ASA 325 mg BID x 2 weeks DISCHARGE PLAN: Home DISCHARGE NEEDS: HHPT Fluid bolus times 2   Dressing change tonight   Saline lock IV    Merina Behrendt J 04/18/2012, 8:05 AM

## 2012-04-18 NOTE — Progress Notes (Signed)
CARE MANAGEMENT NOTE 04/18/2012  Patient:  [name], [name]   Account Number:  [id number]  Date Initiated:  04/18/2012  Documentation initiated by:  Vance Peper  Subjective/Objective Assessment:   61 yr old female s/p right total knee arthroplasty     Action/Plan:   CM spoke with patient regarding HH needs.Choice offered.Preoperatively setup with [facility], no changes.   Anticipated DC Date:  04/21/2012   Anticipated DC Plan:  HOME W HOME HEALTH SERVICES      DC Planning Services  CM consult      [facility] Choice  HOME HEALTH  DURABLE MEDICAL EQUIPMENT   Choice offered to / List presented to:  C-1 Patient   DME arranged  3-N-1      DME agency  [facility]     HH arranged  HH-2 PT      [facility] agency  [facility]   Status of service:  Completed, signed off  Discharge Disposition:  HOME W HOME HEALTH SERVICES  Per UR Regulation:  Reviewed for med. necessity/level of care/duration of stay  If discussed at Long Length of Stay Meetings, dates discussed:    Comments:

## 2012-04-19 LAB — BASIC METABOLIC PANEL
BUN: 15 mg/dL (ref 6–23)
Calcium: 8.7 mg/dL (ref 8.4–10.5)
Creatinine, Ser: 0.69 mg/dL (ref 0.50–1.10)
GFR calc Af Amer: >90 mL/min (ref >90)
GFR calc non Af Amer: >90 mL/min (ref >90)

## 2012-04-19 LAB — CBC
HCT: 33 % — ABNORMAL LOW (ref 36.0–46.0)
MCH: 27.7 pg (ref 26.0–34.0)
MCHC: 32.1 g/dL (ref 30.0–36.0)
MCV: 86.2 fL (ref 78.0–100.0)
Platelets: 228 10*3/uL (ref 150–400)
RDW: 15.1 % (ref 11.5–15.5)
WBC: 8.6 10*3/uL (ref 4.0–10.5)

## 2012-04-19 MED ORDER — DSS 100 MG PO CAPS: 100.0000 mg | ORAL_CAPSULE | Freq: Two times a day (BID) | ORAL | Status: AC

## 2012-04-19 MED ORDER — ENOXAPARIN SODIUM 30 MG/0.3ML ~~LOC~~ SOLN: 30.0000 mg | Freq: Two times a day (BID) | SUBCUTANEOUS | Status: DC

## 2012-04-19 MED ORDER — TAPENTADOL HCL 50 MG PO TABS: 50.0000 mg | ORAL_TABLET | ORAL | Status: DC | PRN

## 2012-04-19 NOTE — Progress Notes (Signed)
Pt discharged home with family.  Pt and spouse verbalized understanding of all instructions.  Home health arranged and DME delivered.

## 2012-04-19 NOTE — Discharge Summary (Signed)
Patient ID: [name] MRN: [id number] DOB/AGE: Nov 07, 1951 61 y.o.  Admit date: 04/17/2012 Discharge date: 04/19/2012  Admission Diagnoses:  Principal Problem:  *Right knee DJD Active Problems:  Arthritis  Depression  Hypertension  Anxiety  Pinched nerve   Discharge Diagnoses:  Same  Past Medical History  Diagnosis Date  . Arthritis   . Right knee DJD   . Left knee DJD 2004    left total knee replacement in 2004  . Depression   . Hypertension   . GERD (gastroesophageal reflux disease)   . Anxiety   . Kidney stones   . Pinched nerve     Surgeries: Procedure(s): TOTAL KNEE ARTHROPLASTY on 04/17/2012   Consultants:    Discharged Condition: Improved  Hospital Course: [name] is an 61 y.o. female who was admitted 04/17/2012 for operative treatment ofRight knee DJD. Patient has severe unremitting pain that affects sleep, daily activities, and work/hobbies. After pre-op clearance the patient was taken to the operating room on 04/17/2012 and underwent  Procedure(s): TOTAL KNEE ARTHROPLASTY.    Patient was given perioperative antibiotics: Anti-infectives     Start     Dose/Rate Route Frequency Ordered Stop   04/17/12 1330   ceFAZolin (ANCEF) IVPB 2 g/50 mL premix        2 g 100 mL/hr over 30 Minutes Intravenous Every 6 hours 04/17/12 1044 04/17/12 1907   04/17/12 0832   cefUROXime (ZINACEF) injection  Status:  Discontinued          As needed 04/17/12 0832 04/17/12 0918   04/16/12 1025   ceFAZolin (ANCEF) IVPB 2 g/50 mL premix        2 g 100 mL/hr over 30 Minutes Intravenous 60 min pre-op 04/16/12 1025 04/17/12 0735           Patient was given sequential compression devices, early ambulation, and chemoprophylaxis to prevent DVT.  Patient benefited maximally from hospital stay and there were no complications.    Recent vital signs: Patient Vitals for the past 24 hrs:  BP Temp Pulse Resp SpO2  04/19/12 0545 154/87 mmHg 99 F (37.2 C) 97  18  97 %    05/09/12 2125 171/80 mmHg 98.9 F (37.2 C) 99  18  96 %  09-May-2012 1300 145/73 mmHg 99 F (37.2 C) 88  18  99 %     Recent laboratory studies:  Basename 04/19/12 0500 05-09-2012 0500  WBC 8.6 10.8*  HGB 10.6* 11.2*  HCT 33.0* 33.7*  PLT 228 251  NA 141 140  K 3.5 4.3  CL 106 105  CO2 28 29  BUN 15 18  CREATININE 0.69 0.70  GLUCOSE 107* 138*  INR -- --  CALCIUM 8.7 --     Discharge Medications:   Medication List  As of 04/19/2012  8:18 AM   TAKE these medications         celecoxib 200 MG capsule   Commonly known as: CELEBREX   Take 200 mg by mouth daily.      cetirizine 10 MG tablet   Commonly known as: ZYRTEC   Take 10 mg by mouth daily.      citalopram 40 MG tablet   Commonly known as: CELEXA   Take 40 mg by mouth daily.      cyclobenzaprine 10 MG tablet   Commonly known as: FLEXERIL   Take 10 mg by mouth 3 (three) times daily as needed. For muscle spasms      DSS 100 MG  Caps   Take 100 mg by mouth 2 (two) times daily.      enoxaparin 30 MG/0.3ML injection   Commonly known as: LOVENOX   Inject 0.3 mLs (30 mg total) into the skin every 12 (twelve) hours.      lisinopril-hydrochlorothiazide 10-12.5 MG per tablet   Commonly known as: PRINZIDE,ZESTORETIC   Take 1 tablet by mouth daily.      pantoprazole 40 MG tablet   Commonly known as: PROTONIX   Take 40 mg by mouth daily.      tapentadol 50 MG Tabs   Commonly known as: NUCYNTA   Take 1 tablet (50 mg total) by mouth every 4 (four) hours as needed.            Diagnostic Studies: No results found.  Disposition:   Discharge Orders    Future Orders Please Complete By Expires   Diet - low sodium heart healthy      Call MD / Call 911      Comments:   If you experience chest pain or shortness of breath, CALL 911 and be transported to the hospital emergency room.  If you develope a fever above 101 F, pus (white drainage) or increased drainage or redness at the wound, or calf pain, call your surgeon's  office.   Constipation Prevention      Comments:   Drink plenty of fluids.  Prune juice may be helpful.  You may use a stool softener, such as Colace (over the counter) 100 mg twice a day.  Use MiraLax (over the counter) for constipation as needed.   Increase activity slowly as tolerated      Discharge instructions      Comments:   Total Knee Replacement Care After Refer to this sheet in the next few weeks. These discharge instructions provide you with general information on caring for yourself after you leave the hospital. Your caregiver may also give you specific instructions. Your treatment has been planned according to the most current medical practices available, but unavoidable complications sometimes occur. If you have any problems or questions after discharge, please call your caregiver. Regaining a near full range of motion of your knee within the first 3 to 6 weeks after surgery is critical. HOME CARE INSTRUCTIONS  You may resume a normal diet and activities as directed.  Perform exercises as directed.  Place yellow foam block, yellow side up under heel at all times except when in CPM or when walking.  DO NOT modify, tear, cut, or change in any way. You will receive physical therapy daily  Take showers instead of baths until informed otherwise.  Change bandages (dressings)daily Do not take over-the-counter or prescription medicines for pain, discomfort, or fever. Eat a well-balanced diet.  Avoid lifting or driving until you are instructed otherwise.  Make an appointment to see your caregiver for stitches (suture) or staple removal as directed.  If you have been sent home with a continuous passive motion machine (CPM machine), 0-90 degrees 6 hrs a day   2 hrs a shift SEEK MEDICAL CARE IF: You have swelling of your calf or leg.  You develop shortness of breath or chest pain.  You have redness, swelling, or increasing pain in the wound.  There is pus or any unusual drainage coming  from the surgical site.  You notice a bad smell coming from the surgical site or dressing.  The surgical site breaks open after sutures or staples have been removed.  There is  persistent bleeding from the suture or staple line.  You are getting worse or are not improving.  You have any other questions or concerns.  SEEK IMMEDIATE MEDICAL CARE IF:  You have a fever.  ny way the yellow foam block.You develop a rash.  You have difficulty breathing.  You develop any reaction or side effects to medicines given.  Your knee motion is decreasing rather than improving.  MAKE SURE YOU:  Understand these instructions.  Will watch your condition.  Will get help right away if you are not doing well or get worse.   CPM      Comments:   Continuous passive motion machine (CPM):      Use the CPM from 0 to 90 for 6 hours per day.       You may break it up into 2 or 3 sessions per day.      Use CPM for 2 weeks or until you are told to stop.   TED hose      Comments:   Use stockings (TED hose) for 2 weeks on both leg(s).  You may remove them at night for sleeping.   Change dressing      Comments:   Change the dressing daily with sterile 4 x 4 inch gauze dressing and apply TED hose.  You may clean the incision with alcohol prior to redressing.   Do not put a pillow under the knee. Place it under the heel.      Comments:   Place yellow foam block, yellow side up under heel at all times except when in CPM or when walking.  DO NOT modify, tear, cut, or change in any way the yellow foam block.      Follow-up Information    Follow up with Nilda Simmer, MD on 05/01/2012. (appt time 3pm)    Contact information:   [facility] [address] [place] [address] [phone]           Signed: Pascal Lux 04/19/2012, 8:18 AM

## 2012-04-19 NOTE — Progress Notes (Signed)
Physical Therapy Treatment Patient Details Name: [name] MRN: [id number] DOB: 21-Oct-1951 Today's Date: 04/19/2012 Time: 1478-2956 PT Time Calculation (min): 23 min  PT Assessment / Plan / Recommendation Comments on Treatment Session  Pt admitted s/p right TKA and continues to progress.  Pt able to tolerate increased ambulation distance with increased independence today as well as stair negotiation.  Pt ready for safe d/c home once medically cleared by MD.    Follow Up Recommendations  Home health PT    Barriers to Discharge        Equipment Recommendations  None recommended by PT    Recommendations for Other Services    Frequency 7X/week   Plan Discharge plan remains appropriate;Frequency remains appropriate    Precautions / Restrictions Precautions Precautions: Knee Precaution Booklet Issued: No Required Braces or Orthoses: Knee Immobilizer - Right Knee Immobilizer - Right: Discontinue post op day 2 Restrictions Weight Bearing Restrictions: Yes RLE Weight Bearing: Weight bearing as tolerated   Pertinent Vitals/Pain 4/10 in right knee.  Pt repositioned with RN aware.    Mobility  Bed Mobility Bed Mobility: Not assessed Transfers Transfers: Sit to Stand;Stand to Sit (2 trials.) Sit to Stand: 6: Modified independent (Device/Increase time);With upper extremity assist;From bed;From chair/3-in-1 Stand to Sit: 6: Modified independent (Device/Increase time);With upper extremity assist;To chair/3-in-1 Ambulation/Gait Ambulation/Gait Assistance: 5: Supervision Ambulation Distance (Feet): 250 Feet Assistive device: Rolling walker Ambulation/Gait Assistance Details: Verbal cues for tall posture, step-through sequence, and initial contact on right heel. Gait Pattern: Step-through pattern;Decreased step length - right;Decreased stance time - right Stairs: Yes Stairs Assistance: 4: Min assist Stairs Assistance Details (indicate cue type and reason): Assist to steady RW during  backwards sequence and for left HHA during forward with one rail.  Cues for sequence using "up with good, down with bad." Stair Management Technique: One rail Right;Step to pattern;Forwards;Backwards;With walker (1. Backward with RW, 2. Forward with right rail/left HHA.) Number of Stairs: 4  (2 steps x 2 trials.) Wheelchair Mobility Wheelchair Mobility: No    Exercises Total Joint Exercises Heel Slides: AAROM;Right;10 reps;Supine Goniometric ROM: AA/ROM right knee 0-70 degrees.   PT Diagnosis:    PT Problem List:   PT Treatment Interventions:     PT Goals Acute Rehab PT Goals PT Goal Formulation: With patient Time For Goal Achievement: 04/25/12 Potential to Achieve Goals: Good PT Goal: Sit to Stand - Progress: Met PT Goal: Stand to Sit - Progress: Met PT Goal: Ambulate - Progress: Progressing toward goal PT Goal: Up/Down Stairs - Progress: Progressing toward goal PT Goal: Perform Home Exercise Program - Progress: Progressing toward goal  Visit Information  Last PT Received On: 04/19/12 Assistance Needed: +1    Subjective Data  Subjective: "Let's go for it." Patient Stated Goal: Go home.    Cognition  Overall Cognitive Status: Appears within functional limits for tasks assessed/performed Arousal/Alertness: Awake/alert Orientation Level: Appears intact for tasks assessed Behavior During Session: [facility] for tasks performed    Balance  Balance Balance Assessed: No  End of Session PT - End of Session Equipment Utilized During Treatment: Gait belt Activity Tolerance: Patient tolerated treatment well Patient left: in chair;with call bell/phone within reach Nurse Communication: Mobility status;Patient requests pain meds CPM Right Knee CPM Right Knee: Off (pt in chair)    Jody Aguinaga M 04/19/2012, 8:51 AM  04/19/2012 Cephus Shelling, PT, DPT [phone]

## 2012-10-09 ENCOUNTER — Other Ambulatory Visit: Payer: Self-pay | Admitting: Internal Medicine

## 2012-10-09 DIAGNOSIS — Z1231 Encounter for screening mammogram for malignant neoplasm of breast: Secondary | ICD-10-CM

## 2012-10-13 ENCOUNTER — Ambulatory Visit
Admission: RE | Admit: 2012-10-13 | Discharge: 2012-10-13 | Disposition: A | Discharge status: Home / Self Care | Payer: 59 | Source: Ambulatory Visit | Attending: Internal Medicine | Admitting: Internal Medicine

## 2012-10-13 DIAGNOSIS — Z1231 Encounter for screening mammogram for malignant neoplasm of breast: Secondary | ICD-10-CM

## 2012-11-08 HISTORY — PX: ANKLE ARTHROSCOPY: SUR85

## 2012-11-13 ENCOUNTER — Ambulatory Visit: Payer: 59

## 2013-02-21 ENCOUNTER — Other Ambulatory Visit: Payer: Self-pay | Admitting: Gastroenterology

## 2013-03-27 ENCOUNTER — Encounter (HOSPITAL_COMMUNITY): Payer: Self-pay | Admitting: Pharmacy Technician

## 2013-03-27 ENCOUNTER — Encounter (HOSPITAL_COMMUNITY): Payer: Self-pay | Admitting: *Deleted

## 2013-04-10 ENCOUNTER — Encounter (HOSPITAL_COMMUNITY)
Admission: RE | Disposition: A | Discharge status: Home / Self Care | Payer: Self-pay | Source: Ambulatory Visit | Attending: Gastroenterology

## 2013-04-10 ENCOUNTER — Encounter (HOSPITAL_COMMUNITY): Payer: Self-pay | Admitting: *Deleted

## 2013-04-10 ENCOUNTER — Ambulatory Visit (HOSPITAL_COMMUNITY): Payer: 59 | Admitting: Anesthesiology

## 2013-04-10 ENCOUNTER — Ambulatory Visit (HOSPITAL_COMMUNITY)
Admission: RE | Admit: 2013-04-10 | Discharge: 2013-04-10 | Disposition: A | Discharge status: Home / Self Care | Payer: 59 | Source: Ambulatory Visit | Attending: Gastroenterology | Admitting: Gastroenterology

## 2013-04-10 ENCOUNTER — Encounter (HOSPITAL_COMMUNITY): Payer: Self-pay | Admitting: Anesthesiology

## 2013-04-10 DIAGNOSIS — Z860100 Personal history of colon polyps, unspecified: Secondary | ICD-10-CM | POA: Insufficient documentation

## 2013-04-10 DIAGNOSIS — I10 Essential (primary) hypertension: Secondary | ICD-10-CM | POA: Insufficient documentation

## 2013-04-10 DIAGNOSIS — K5730 Diverticulosis of large intestine without perforation or abscess without bleeding: Secondary | ICD-10-CM | POA: Insufficient documentation

## 2013-04-10 DIAGNOSIS — Z79899 Other long term (current) drug therapy: Secondary | ICD-10-CM | POA: Insufficient documentation

## 2013-04-10 DIAGNOSIS — K589 Irritable bowel syndrome without diarrhea: Secondary | ICD-10-CM | POA: Insufficient documentation

## 2013-04-10 DIAGNOSIS — Z1211 Encounter for screening for malignant neoplasm of colon: Secondary | ICD-10-CM | POA: Insufficient documentation

## 2013-04-10 DIAGNOSIS — K219 Gastro-esophageal reflux disease without esophagitis: Secondary | ICD-10-CM | POA: Insufficient documentation

## 2013-04-10 HISTORY — PX: COLONOSCOPY WITH PROPOFOL: SHX5780

## 2013-04-10 SURGERY — COLONOSCOPY WITH PROPOFOL
Anesthesia: Monitor Anesthesia Care

## 2013-04-10 MED ORDER — LACTATED RINGERS IV SOLN
INTRAVENOUS | Status: DC
  Administered 2013-04-10: 12:00:00 via INTRAVENOUS

## 2013-04-10 MED ORDER — KETAMINE HCL 50 MG/ML IJ SOLN
INTRAMUSCULAR | Status: DC | PRN
  Administered 2013-04-10: 15 mg via INTRAMUSCULAR
  Administered 2013-04-10: 10 mg via INTRAMUSCULAR

## 2013-04-10 MED ORDER — PROMETHAZINE HCL 25 MG/ML IJ SOLN: 6.2500 mg | INTRAMUSCULAR | Status: DC | PRN

## 2013-04-10 MED ORDER — PROPOFOL INFUSION 10 MG/ML OPTIME
INTRAVENOUS | Status: DC | PRN
  Administered 2013-04-10: 75 ug/kg/min via INTRAVENOUS

## 2013-04-10 MED ORDER — SODIUM CHLORIDE 0.9 % IV SOLN
INTRAVENOUS | Status: DC
Start: 2013-04-10 — End: 2013-04-10

## 2013-04-10 MED ORDER — PROPOFOL 10 MG/ML IV BOLUS
INTRAVENOUS | Status: DC | PRN
  Administered 2013-04-10 (×2): 20 mg via INTRAVENOUS

## 2013-04-10 SURGICAL SUPPLY — 22 items

## 2013-04-10 NOTE — Op Note (Signed)
Procedure: Surveillance colonoscopy. Small adenomatous colon polyps removed in 2003. Normal surveillance colonoscopy in 2009.  Endoscopist: Danise Edge  Premedication: Propofol administered by anesthesia  Procedure: The patient was placed in the left lateral decubitus position. Anal inspection and digital rectal exam were normal. The Pentax pediatric colonoscope was introduced into the rectum and advanced to the cecum. A normal-appearing ileocecal valve and appendiceal orifice were identified. Colonic preparation for the exam today was good.  Rectum. Normal. Retroflex view of the distal rectum normal.  Sigmoid colon and descending colon. Left colonic diverticulosis.  Splenic flexure. Normal.  Transverse colon. Normal.  Hepatic flexure. Normal.  Ascending colon. Normal.  Cecum and ileocecal valve. Normal.  Assessment: Normal surveillance proctocolonoscopy to the cecum.  Recommendations: Schedule repeat surveillance colonoscopy in 5 years.

## 2013-04-10 NOTE — Transfer of Care (Signed)
Immediate Anesthesia Transfer of Care Note  Patient: [name]  Procedure(s) Performed: Procedure(s) (LRB): COLONOSCOPY WITH PROPOFOL (N/A)  Patient Location: PACU  Anesthesia Type: MAC  Level of Consciousness: sedated, patient cooperative and responds to stimulaton  Airway & Oxygen Therapy: Patient Spontanous Breathing and Patient connected to face mask oxgen  Post-op Assessment: Report given to PACU RN and Post -op Vital signs reviewed and stable  Post vital signs: Reviewed and stable  Complications: No apparent anesthesia complications

## 2013-04-10 NOTE — H&P (Signed)
  Procedure: Surveillance colonoscopy  History: The patient is a 62 year old female born [birth year]. In 1999, the patient underwent a colonoscopy with of small adenomatous and hyperplastic polyps. In 2004, she underwent a normal surveillance colonoscopy.  The patient is scheduled undergo a repeat surveillance colonoscopy today.  Chronic medications: Lisinopril. Hydrochlorothiazide. Omeprazole. Citalopram. Celebrex. Flonase.  Past medical and surgical history: Hypertension. Gastroesophageal reflux. Depression. Allergic rhinitis. Kidney stone. Recurrent urinary tract infections. Left knee replacement surgery. Carpal tunnel syndrome. Left colonic diverticulosis. Constipation predominant irritable bowel syndrome. Hypercholesterolemia. Tonsillectomy. Bilateral tubal ligation. Foot surgery. Left shoulder arthroscopy. Left knee arthroscopy. Lithotripsy. Bilateral carpal tunnel release surgery. Bladder repair surgery.  Medication allergies: Sulfa and oxycodone  Exam: The patient is alert and lying comfortably on the endoscopy stretcher. Abdomen is soft, flat, and nontender to palpation. Cardiac exam reveals a regular rhythm. Lungs are clear to auscultation.  Plan: Proceed with surveillance colonoscopy using propofol sedation.

## 2013-04-10 NOTE — Anesthesia Preprocedure Evaluation (Addendum)
Anesthesia Evaluation  Patient identified by MRN, date of birth, ID band Patient awake    Reviewed: Allergy & Precautions, H&P , NPO status , Patient's Chart, lab work & pertinent test results  Airway Mallampati: II TM Distance: >3 FB Neck ROM: Full    Dental  (+) Teeth Intact and Dental Advisory Given   Pulmonary neg pulmonary ROS, former smoker,  breath sounds clear to auscultation        Cardiovascular hypertension, Pt. on medications Rhythm:Regular Rate:Normal     Neuro/Psych negative neurological ROS  negative psych ROS   GI/Hepatic Neg liver ROS, GERD-  ,  Endo/Other  negative endocrine ROS  Renal/GU Renal disease  negative genitourinary   Musculoskeletal negative musculoskeletal ROS (+)   Abdominal   Peds  Hematology negative hematology ROS (+)   Anesthesia Other Findings   Reproductive/Obstetrics                          Anesthesia Physical Anesthesia Plan  ASA: II  Anesthesia Plan: MAC   Post-op Pain Management:    Induction: Intravenous  Airway Management Planned: Simple Face Mask  Additional Equipment:   Intra-op Plan:   Post-operative Plan:   Informed Consent: I have reviewed the patients History and Physical, chart, labs and discussed the procedure including the risks, benefits and alternatives for the proposed anesthesia with the patient or authorized representative who has indicated his/her understanding and acceptance.   Dental advisory given  Plan Discussed with: CRNA  Anesthesia Plan Comments:         Anesthesia Quick Evaluation

## 2013-04-10 NOTE — Anesthesia Postprocedure Evaluation (Signed)
Anesthesia Post Note  Patient: [name]  Procedure(s) Performed: Procedure(s) (LRB): COLONOSCOPY WITH PROPOFOL (N/A)  Anesthesia type: MAC  Patient location: PACU  Post pain: Pain level controlled  Post assessment: Post-op Vital signs reviewed  Last Vitals:  Filed Vitals:   04/10/13 1316  BP: 150/76  Temp:   Resp: 16    Post vital signs: Reviewed  Level of consciousness: sedated  Complications: No apparent anesthesia complications

## 2013-04-11 ENCOUNTER — Encounter (HOSPITAL_COMMUNITY): Payer: Self-pay | Admitting: Gastroenterology

## 2013-09-28 ENCOUNTER — Other Ambulatory Visit: Payer: Self-pay

## 2013-09-28 DIAGNOSIS — Z1231 Encounter for screening mammogram for malignant neoplasm of breast: Secondary | ICD-10-CM

## 2013-10-25 ENCOUNTER — Ambulatory Visit
Admission: RE | Admit: 2013-10-25 | Discharge: 2013-10-25 | Disposition: A | Discharge status: Home / Self Care | Payer: 59 | Source: Ambulatory Visit

## 2013-10-25 DIAGNOSIS — Z1231 Encounter for screening mammogram for malignant neoplasm of breast: Secondary | ICD-10-CM

## 2015-01-01 ENCOUNTER — Other Ambulatory Visit: Payer: Self-pay

## 2015-01-01 DIAGNOSIS — Z1231 Encounter for screening mammogram for malignant neoplasm of breast: Secondary | ICD-10-CM

## 2015-01-06 ENCOUNTER — Encounter (INDEPENDENT_AMBULATORY_CARE_PROVIDER_SITE_OTHER): Payer: Self-pay

## 2015-01-06 ENCOUNTER — Ambulatory Visit
Admission: RE | Admit: 2015-01-06 | Discharge: 2015-01-06 | Disposition: A | Discharge status: Home / Self Care | Payer: BLUE CROSS/BLUE SHIELD | Source: Ambulatory Visit

## 2015-01-06 DIAGNOSIS — Z1231 Encounter for screening mammogram for malignant neoplasm of breast: Secondary | ICD-10-CM

## 2016-03-11 ENCOUNTER — Other Ambulatory Visit: Payer: Self-pay

## 2016-03-11 DIAGNOSIS — Z1231 Encounter for screening mammogram for malignant neoplasm of breast: Secondary | ICD-10-CM

## 2016-03-25 ENCOUNTER — Ambulatory Visit
Admission: RE | Admit: 2016-03-25 | Discharge: 2016-03-25 | Disposition: A | Discharge status: Home / Self Care | Payer: Medicare Other | Source: Ambulatory Visit

## 2016-03-25 DIAGNOSIS — Z1231 Encounter for screening mammogram for malignant neoplasm of breast: Secondary | ICD-10-CM

## 2016-10-19 ENCOUNTER — Other Ambulatory Visit: Payer: Self-pay | Admitting: Urology

## 2016-11-16 ENCOUNTER — Encounter (HOSPITAL_BASED_OUTPATIENT_CLINIC_OR_DEPARTMENT_OTHER): Payer: Self-pay | Admitting: *Deleted

## 2016-11-16 NOTE — Progress Notes (Signed)
NPO AFTER MN.  ARRIVE AT 0900.  NEEDS ISTAT 8 AND EKG.  WILL TAKE CHLOR-TAB, CELEXA, AND PRILOSEC AM DOS W/ SIPS OF WATER.

## 2016-11-18 NOTE — H&P (Signed)
Office Visit Report 10/11/2016    [name]         MRN: [id number]  PRIMARY CARE:  Thora Lance, MD  DOB: 22-Aug-1951, 66 year old Female  REFERRING:  Tayler Lassen I. Patsi Sears, MD  [phone]  PROVIDER:  Jethro Bolus, M.D.    LOCATION:  [facility] [phone]    CC: I have stress incontinence.  HPI: [name] is a 66 year-old female established patient who is here for stress incontinence.  She does leak urine when she coughs, laughs, sneezes or bears down. She does have problems getting to the bathroom in time after she has the urge to urinate. She wears 1-2 pads per day.   She does not have an abnormal sensation when needing to urinate. She is not having problems with emptying her bladder well. She does not have hesitancy or straining.   She has 1 children. Her children were delivered by a vaginal delivery. Patient denies c-section. She has undergone surgery for treatment. She is willing to undergo surgical treatment for her stress incontinence.   She returns today to discuss the sling procedure. She has done multiple sessions of PT, but did not find it effective. Previously discussed alternatives for treatment, including injection therapy, and mini-sling. However, I think she will need exploration, and removal of her prior sling, with care taken to avoid urethral disruption, and urethrolysis with exploration to find probable suture above the urethra. She will then need a new sling-probable fascia.   Video urodynamics is accomplished on 09/16/15, in sitting position. The patient appears to have stress urinary incontinence with a maximum capacity of 230 cc. She has mild low amplitude instability, which she can inhibit. There is no reflux. She does empty efficiently, leaving a residual of 20-30 cc.   She is status post pubovaginal sling in 2008 per gynecology. She had postop urethral vaginal extrusion, with surgical excision of a corner of the sling. She currently  feels as if there is "fishing line around the clitoris".      ALLERGIES: Oxycodone-Acetaminophen CAPS Sulfa Drugs    MEDICATIONS: CeleBREX 200 MG Oral Capsule Oral  Citalopram Hydrobromide 40 MG Oral Tablet Oral  Lisinopril-Hydrochlorothiazide 10-12.5 MG Oral Tablet Oral  Pantoprazole Sodium 40 MG Oral Tablet Delayed Release Oral     GU PSH: None     PSH Notes: Hand Surgery, Wrist Surgery   NON-GU PSH: Appendectomy - 2016 Bmi>=30Or<22 Cal No Followup - 06/28/2016 Doc Meds Verified W/Pt Or Re - 06/28/2016 Other Pt/Ot Current Status - 06/28/2016 Other Pt/Ot Goal Status - 06/28/2016 Pain Doc Pos And Plan - 06/28/2016 Revise Knee Joint - 2016        GU PMH: Stress Incontinence, M/F - 06/15/2016 Erosion implanted vaginal mesh to surrounding organ or tissue, initial enc, Erosion of vaginal mesh, initial encounter - 09/29/2015 Kidney Stone, Nephrolithiasis - 08/27/2015, Left nephrolithiasis, - 08/27/2015 Mixed incontinence, Urge and stress incontinence - 08/27/2015 Gross hematuria, Gross hematuria - 2016 Renal Colic, Renal colic on left side - 2016      PMH Notes:  2015-09-29 16:13:48 - Note: Vaginal Sling Operation For Stress Incontinence   NON-GU PMH: Muscle weakness (generalized) - 07/16/2016, - 06/28/2016 Other lack of coordination - 07/16/2016, - 06/28/2016 Other muscle spasm - 07/16/2016, - 07/07/2016 Other specified dyspareunia - 06/28/2016 Deep dyspareunia - 06/15/2016 Encounter for general adult medical examination without abnormal findings, Encounter for preventive health examination - 08/27/2015 Personal history of other diseases of the circulatory system,  History of hypertension - 2016, History of cardiac arrhythmia, - 2016 Personal history of other diseases of the digestive system, History of esophageal reflux - 2016 Personal history of other mental and behavioral disorders, History of depression - 2016    FAMILY HISTORY: Hypertension - Runs In Family Kidney Stones - Runs In Family     SOCIAL HISTORY: Marital Status: Married Current Smoking Status: Patient does not smoke anymore. Has not smoked since 06/08/1997.  Has never drank.  Patient's occupation is/was retired.     Notes: one child   REVIEW OF SYSTEMS:     GU Review Female:  Patient reports hard to postpone urination, get up at night to urinate, and leakage of urine. Patient denies frequent urination, burning /pain with urination, stream starts and stops, trouble starting your stream, have to strain to urinate, and currently pregnant.    Gastrointestinal (Upper):  Patient reports indigestion/ heartburn. Patient denies nausea and vomiting.    Gastrointestinal (Lower):  Patient denies diarrhea and constipation.    Constitutional:  Patient denies fever, night sweats, weight loss, and fatigue.    Skin:  Patient denies skin rash/ lesion and itching.    Eyes:  Patient denies blurred vision and double vision.    Ears/ Nose/ Throat:  Patient denies sore throat and sinus problems.    Hematologic/Lymphatic:  Patient denies swollen glands and easy bruising.    Cardiovascular:  Patient denies leg swelling and chest pains.    Respiratory:  Patient denies cough and shortness of breath.    Endocrine:  Patient denies excessive thirst.    Musculoskeletal:  Patient denies back pain and joint pain.    Neurological:  Patient denies headaches and dizziness.    Psychologic:  Patient denies depression and anxiety.    VITAL SIGNS:       10/11/2016 04:34 PM     BP 136/78 mmHg     Pulse 76 /min     Temperature 98.2 F / 37 C     GU PHYSICAL EXAMINATION:      External Genitalia: No hirsutism, no rash, no scarring, no cyst, no erythematous lesion, no papular lesion, no blanched lesion, no warty lesion. No edema.     Urethral Meatus: Normal size. Normal position. No discharge. pain to palpation. No evidence of sling extrusion.     Urethra: No tenderness, no mass, no scarring. No hypermobility. No leakage.     Bladder: Normal to palpation, no  tenderness, no mass, normal size.     Vagina: No atrophy, no stenosis. No rectocele. No cystocele. No enterocele. significant bilateral levator plate tenderness and tightness.     Cervix: No inflammation, no discharge, no lesion, no tenderness, no wart.     Uterus: Normal size. Normal consistency. Normal position. No mobility. No descent.     Adnexa / Parametria: No tenderness. No adnexal mass. Normal left ovary. Normal right ovary.     MULTI-SYSTEM PHYSICAL EXAMINATION:      Constitutional: Well-nourished. No physical deformities. Normally developed. Good grooming.     Neck: Neck symmetrical, not swollen. Normal tracheal position.     Respiratory: No labored breathing, no use of accessory muscles.      Cardiovascular: Normal temperature, normal extremity pulses, no swelling, no varicosities.     Lymphatic: No enlargement of neck, axillae, groin.     Skin: No paleness, no jaundice, no cyanosis. No lesion, no ulcer, no rash.     Neurologic / Psychiatric: Oriented to time, oriented to place, oriented to person.  No depression, no anxiety, no agitation.     Gastrointestinal: No mass, no tenderness, no rigidity, non obese abdomen.     Eyes: Normal conjunctivae. Normal eyelids.     Ears, Nose, Mouth, and Throat: Left ear no scars, no lesions, no masses. Right ear no scars, no lesions, no masses. Nose no scars, no lesions, no masses. Normal hearing. Normal lips.     Musculoskeletal: Normal gait and station of head and neck.            PAST DATA REVIEWED:   Source Of History:  Patient  Records Review:  Previous Patient Records    10/11/16 06/15/16  Urinalysis  Urine Appearance Clear    Urine Color Yellow    Urine Glucose Neg    Urine Bilirubin Neg    Urine Ketones Neg    Urine Specific Gravity 1.025    Urine Blood Neg    Urine pH 5.5    Urine Protein Neg    Urine Urobilinogen 0.2    Urine Nitrites Neg    Urine Leukocyte Esterase Neg  Neg    PROCEDURES:    Urinalysis  Dipstick  Dipstick Cont'd  Color: Yellow Bilirubin: Neg  Appearance: Clear Ketones: Neg  Specific Gravity: 1.025 Blood: Neg  pH: 5.5 Protein: Neg  Glucose: Neg Urobilinogen: 0.2   Nitrites: Neg   Leukocyte Esterase: Neg    ASSESSMENT:     ICD-10 Details  1 GU:  Stress Incontinence, M/F - N39.3   2  Erosion implanted vaginal mesh to surrounding organ or tissue, initial enc - T83.711A   3  Mixed incontinence - N39.46   4 NON-GU:  Deep dyspareunia - N94.12    Notes:  [name] is a 66 year old female, para 1-1-0, currently postmenopausal. She is status post pubovaginal sling in 2008 per GYN for stress urinary incontinence. She did well until 2015 when she began to feel a "fishing line" wrapped around her clitoris. She also began leaking again with cough, laugh, and sneeze. The patient has been wearing and he liners ever since her surgery. She has a BMI of 38.   She has been seen by pelvic floor physical therapy, with resolution of her clitoral pain. She has had video urodynamics, showing a cough leak point pressure at 220 cc of 83 cm of water pressure, with leakage. She has a maximum flow rate of 12 cc/s, with a detrusor pressure of 16 cm of water pressure.   On physical examination, she has a normal-appearing urethra, and a normal urethral palpation without pain. She was felt to have bilateral levator plate pain, and that is why she was sent to physical therapy. This has been relieved preoperatively. She is now ready for surgical intervention, which will require excision of her original sling. She no longer needs urethral lysis, but will need repeat pubovaginal sling.   I will ask Dr. Mina MarbleMacDiarmid's advice and help pre operatively.    PLAN:   Schedule  Return Visit: Next Available Appointment - Schedule Surgery  Document  Letter(s):  Created for Patient: Clinical Summary   Signed by Jethro BolusSigmund Harumi Yamin, M.D. on 10/12/16 at 8:17 PM (EST)  The information contained in this medical record document is  considered private and confidential patient information. This information can only be used for the medical diagnosis and/or medical services that are being provided by the patient's selected caregivers. This information can only be distributed outside of the patient's care if the patient agrees and signs waivers of authorization for this information  to be sent to an outside source or route.

## 2016-11-19 ENCOUNTER — Ambulatory Visit (HOSPITAL_BASED_OUTPATIENT_CLINIC_OR_DEPARTMENT_OTHER): Payer: Medicare Other | Admitting: Anesthesiology

## 2016-11-19 ENCOUNTER — Encounter (HOSPITAL_BASED_OUTPATIENT_CLINIC_OR_DEPARTMENT_OTHER): Payer: Self-pay | Admitting: Anesthesiology

## 2016-11-19 ENCOUNTER — Ambulatory Visit (HOSPITAL_BASED_OUTPATIENT_CLINIC_OR_DEPARTMENT_OTHER)
Admission: RE | Admit: 2016-11-19 | Discharge: 2016-11-19 | Disposition: A | Discharge status: Home / Self Care | Payer: Medicare Other | Source: Ambulatory Visit | Attending: Urology | Admitting: Urology

## 2016-11-19 ENCOUNTER — Encounter (HOSPITAL_BASED_OUTPATIENT_CLINIC_OR_DEPARTMENT_OTHER)
Admission: RE | Disposition: A | Discharge status: Home / Self Care | Payer: Self-pay | Source: Ambulatory Visit | Attending: Urology

## 2016-11-19 ENCOUNTER — Other Ambulatory Visit: Payer: Self-pay

## 2016-11-19 DIAGNOSIS — M1990 Unspecified osteoarthritis, unspecified site: Secondary | ICD-10-CM | POA: Diagnosis not present

## 2016-11-19 DIAGNOSIS — Y762 Prosthetic and other implants, materials and accessory obstetric and gynecological devices associated with adverse incidents: Secondary | ICD-10-CM | POA: Insufficient documentation

## 2016-11-19 DIAGNOSIS — T83711A Erosion of implanted vaginal mesh and other prosthetic materials to surrounding organ or tissue, initial encounter: Secondary | ICD-10-CM | POA: Insufficient documentation

## 2016-11-19 DIAGNOSIS — Z885 Allergy status to narcotic agent status: Secondary | ICD-10-CM | POA: Diagnosis not present

## 2016-11-19 DIAGNOSIS — F329 Major depressive disorder, single episode, unspecified: Secondary | ICD-10-CM | POA: Insufficient documentation

## 2016-11-19 DIAGNOSIS — I10 Essential (primary) hypertension: Secondary | ICD-10-CM | POA: Diagnosis not present

## 2016-11-19 DIAGNOSIS — K449 Diaphragmatic hernia without obstruction or gangrene: Secondary | ICD-10-CM | POA: Insufficient documentation

## 2016-11-19 DIAGNOSIS — N3946 Mixed incontinence: Secondary | ICD-10-CM | POA: Insufficient documentation

## 2016-11-19 DIAGNOSIS — Z87891 Personal history of nicotine dependence: Secondary | ICD-10-CM | POA: Insufficient documentation

## 2016-11-19 DIAGNOSIS — T83721A Exposure of implanted vaginal mesh and other prosthetic materials into vagina, initial encounter: Secondary | ICD-10-CM | POA: Diagnosis present

## 2016-11-19 DIAGNOSIS — F419 Anxiety disorder, unspecified: Secondary | ICD-10-CM | POA: Diagnosis not present

## 2016-11-19 DIAGNOSIS — N9412 Deep dyspareunia: Secondary | ICD-10-CM | POA: Diagnosis not present

## 2016-11-19 DIAGNOSIS — K219 Gastro-esophageal reflux disease without esophagitis: Secondary | ICD-10-CM | POA: Insufficient documentation

## 2016-11-19 DIAGNOSIS — N393 Stress incontinence (female) (male): Secondary | ICD-10-CM

## 2016-11-19 DIAGNOSIS — Z882 Allergy status to sulfonamides status: Secondary | ICD-10-CM | POA: Insufficient documentation

## 2016-11-19 HISTORY — PX: PUBOVAGINAL SLING: SHX1035

## 2016-11-19 HISTORY — DX: Family history of other specified conditions: Z84.89

## 2016-11-19 HISTORY — DX: Personal history of colon polyps, unspecified: Z86.0100

## 2016-11-19 HISTORY — DX: Sciatica, right side: M54.31

## 2016-11-19 HISTORY — DX: Diaphragmatic hernia without obstruction or gangrene: K44.9

## 2016-11-19 HISTORY — DX: Bursopathy, unspecified: M71.9

## 2016-11-19 HISTORY — DX: Presence of spectacles and contact lenses: Z97.3

## 2016-11-19 HISTORY — DX: Unspecified osteoarthritis, unspecified site: M19.90

## 2016-11-19 HISTORY — DX: Stress incontinence (female) (male): N39.3

## 2016-11-19 HISTORY — DX: Personal history of colonic polyps: Z86.010

## 2016-11-19 HISTORY — PX: CYSTOSCOPY: SHX5120

## 2016-11-19 HISTORY — DX: Migraine, unspecified, not intractable, without status migrainosus: G43.909

## 2016-11-19 HISTORY — DX: Personal history of urinary calculi: Z87.442

## 2016-11-19 LAB — POCT I-STAT, CHEM 8
BUN: 23 mg/dL — AB (ref 6–20)
CHLORIDE: 102 mmol/L (ref 101–111)
CREATININE: 0.9 mg/dL (ref 0.44–1.00)
Calcium, Ion: 1.23 mmol/L (ref 1.15–1.40)
GLUCOSE: 108 mg/dL — AB (ref 65–99)
HCT: 42 % (ref 36.0–46.0)
HEMOGLOBIN: 14.3 g/dL (ref 12.0–15.0)
POTASSIUM: 3.5 mmol/L (ref 3.5–5.1)
Sodium: 143 mmol/L (ref 135–145)
TCO2: 27 mmol/L (ref 0–100)

## 2016-11-19 SURGERY — CREATION, PUBOVAGINAL SLING
Anesthesia: General | Site: Vagina

## 2016-11-19 MED ORDER — HEMOSTATIC AGENTS (NO CHARGE) OPTIME
TOPICAL | Status: DC | PRN
  Administered 2016-11-19: 1 via TOPICAL

## 2016-11-19 MED ORDER — PROPOFOL 10 MG/ML IV BOLUS
INTRAVENOUS | Status: AC
  Filled 2016-11-19: qty 20

## 2016-11-19 MED ORDER — SODIUM CHLORIDE 0.9 % IR SOLN
Status: DC | PRN
  Administered 2016-11-19: 500 mL

## 2016-11-19 MED ORDER — FLUORESCEIN SODIUM 10 % IV SOLN
INTRAVENOUS | Status: DC | PRN
  Administered 2016-11-19: 50 mg via INTRAVENOUS

## 2016-11-19 MED ORDER — SODIUM CHLORIDE 0.9 % IR SOLN
Status: DC | PRN
  Administered 2016-11-19: 1000 mL

## 2016-11-19 MED ORDER — CEFAZOLIN SODIUM-DEXTROSE 2-4 GM/100ML-% IV SOLN
2.0000 g | INTRAVENOUS | Status: AC
  Administered 2016-11-19: 2 g via INTRAVENOUS
  Filled 2016-11-19: qty 100

## 2016-11-19 MED ORDER — DEXAMETHASONE SODIUM PHOSPHATE 10 MG/ML IJ SOLN
INTRAMUSCULAR | Status: AC
  Filled 2016-11-19: qty 1

## 2016-11-19 MED ORDER — ONDANSETRON HCL 4 MG/2ML IJ SOLN
INTRAMUSCULAR | Status: DC | PRN
  Administered 2016-11-19: 4 mg via INTRAVENOUS

## 2016-11-19 MED ORDER — TRAMADOL-ACETAMINOPHEN 37.5-325 MG PO TABS: 1.0000 | ORAL_TABLET | Freq: Four times a day (QID) | ORAL | 0 refills | Status: DC | PRN

## 2016-11-19 MED ORDER — PROPOFOL 10 MG/ML IV BOLUS
INTRAVENOUS | Status: DC | PRN
  Administered 2016-11-19: 200 mg via INTRAVENOUS

## 2016-11-19 MED ORDER — ACETAMINOPHEN 500 MG PO TABS
1000.0000 mg | ORAL_TABLET | ORAL | Status: AC
  Administered 2016-11-19: 1000 mg via ORAL
  Filled 2016-11-19: qty 2

## 2016-11-19 MED ORDER — ONDANSETRON HCL 4 MG/2ML IJ SOLN
INTRAMUSCULAR | Status: AC
  Filled 2016-11-19: qty 2

## 2016-11-19 MED ORDER — LIDOCAINE 2% (20 MG/ML) 5 ML SYRINGE
INTRAMUSCULAR | Status: DC | PRN
  Administered 2016-11-19: 100 mg via INTRAVENOUS

## 2016-11-19 MED ORDER — FENTANYL CITRATE (PF) 100 MCG/2ML IJ SOLN
INTRAMUSCULAR | Status: AC
  Filled 2016-11-19: qty 2

## 2016-11-19 MED ORDER — FLUORESCEIN SODIUM 10 % IV SOLN
INTRAVENOUS | Status: AC
  Filled 2016-11-19: qty 5

## 2016-11-19 MED ORDER — DEXAMETHASONE SODIUM PHOSPHATE 4 MG/ML IJ SOLN
INTRAMUSCULAR | Status: DC | PRN
  Administered 2016-11-19: 10 mg via INTRAVENOUS

## 2016-11-19 MED ORDER — MEPERIDINE HCL 25 MG/ML IJ SOLN
6.2500 mg | INTRAMUSCULAR | Status: DC | PRN
  Filled 2016-11-19: qty 1

## 2016-11-19 MED ORDER — CEFAZOLIN SODIUM-DEXTROSE 2-4 GM/100ML-% IV SOLN
INTRAVENOUS | Status: AC
  Filled 2016-11-19: qty 100

## 2016-11-19 MED ORDER — ACETAMINOPHEN 500 MG PO TABS
ORAL_TABLET | ORAL | Status: AC
  Filled 2016-11-19: qty 2

## 2016-11-19 MED ORDER — PROMETHAZINE HCL 25 MG/ML IJ SOLN
6.2500 mg | INTRAMUSCULAR | Status: DC | PRN
  Filled 2016-11-19: qty 1

## 2016-11-19 MED ORDER — ESTRADIOL 0.1 MG/GM VA CREA
TOPICAL_CREAM | VAGINAL | Status: DC | PRN
  Administered 2016-11-19: 1 via VAGINAL

## 2016-11-19 MED ORDER — SODIUM CHLORIDE 0.9 % IV SOLN
INTRAVENOUS | Status: DC | PRN
  Administered 2016-11-19: 10 mL via VAGINAL

## 2016-11-19 MED ORDER — BELLADONNA ALKALOIDS-OPIUM 16.2-60 MG RE SUPP
RECTAL | Status: AC
  Filled 2016-11-19: qty 1

## 2016-11-19 MED ORDER — LACTATED RINGERS IV SOLN
INTRAVENOUS | Status: DC
  Administered 2016-11-19 (×2): via INTRAVENOUS
  Filled 2016-11-19: qty 1000

## 2016-11-19 MED ORDER — KETOROLAC TROMETHAMINE 30 MG/ML IJ SOLN
INTRAMUSCULAR | Status: DC | PRN
  Administered 2016-11-19: 30 mg via INTRAVENOUS

## 2016-11-19 MED ORDER — EPHEDRINE 5 MG/ML INJ
INTRAVENOUS | Status: AC
  Filled 2016-11-19: qty 10

## 2016-11-19 MED ORDER — LIDOCAINE 2% (20 MG/ML) 5 ML SYRINGE
INTRAMUSCULAR | Status: AC
  Filled 2016-11-19: qty 5

## 2016-11-19 MED ORDER — FENTANYL CITRATE (PF) 100 MCG/2ML IJ SOLN
INTRAMUSCULAR | Status: DC | PRN
  Administered 2016-11-19 (×8): 25 ug via INTRAVENOUS

## 2016-11-19 MED ORDER — MIDAZOLAM HCL 5 MG/5ML IJ SOLN
INTRAMUSCULAR | Status: DC | PRN
  Administered 2016-11-19: 2 mg via INTRAVENOUS

## 2016-11-19 MED ORDER — LACTATED RINGERS IV SOLN
INTRAVENOUS | Status: DC
  Filled 2016-11-19: qty 1000

## 2016-11-19 MED ORDER — EPHEDRINE SULFATE 50 MG/ML IJ SOLN
INTRAMUSCULAR | Status: DC | PRN
  Administered 2016-11-19: 10 mg via INTRAVENOUS

## 2016-11-19 MED ORDER — FENTANYL CITRATE (PF) 100 MCG/2ML IJ SOLN
25.0000 ug | INTRAMUSCULAR | Status: DC | PRN
  Filled 2016-11-19: qty 1

## 2016-11-19 MED ORDER — MIDAZOLAM HCL 2 MG/2ML IJ SOLN
INTRAMUSCULAR | Status: AC
  Filled 2016-11-19: qty 2

## 2016-11-19 SURGICAL SUPPLY — 69 items
ADH SKN CLS APL DERMABOND .7 (GAUZE/BANDAGES/DRESSINGS)
BAG URINE DRAINAGE (UROLOGICAL SUPPLIES) ×4 IMPLANT
BLADE CLIPPER SURG (BLADE) ×4 IMPLANT
BLADE SURG 10 STRL SS (BLADE) ×2 IMPLANT
BLADE SURG 15 STRL LF DISP TIS (BLADE) ×2 IMPLANT
BLADE SURG 15 STRL SS (BLADE) ×4
BOOTIES KNEE HIGH SLOAN (MISCELLANEOUS) ×4 IMPLANT
CANISTER SUCTION 1200CC (MISCELLANEOUS) IMPLANT
CANISTER SUCTION 2500CC (MISCELLANEOUS) ×8 IMPLANT
CATH FOLEY 2WAY SLVR  5CC 16FR (CATHETERS) ×2
CATH FOLEY 2WAY SLVR 5CC 16FR (CATHETERS) ×2 IMPLANT
COVER BACK TABLE 60X90IN (DRAPES) ×4 IMPLANT
COVER MAYO STAND STRL (DRAPES) ×4 IMPLANT
DERMABOND ADVANCED (GAUZE/BANDAGES/DRESSINGS)
DERMABOND ADVANCED .7 DNX12 (GAUZE/BANDAGES/DRESSINGS) IMPLANT
DEVICE CAPIO SLIM BOX (INSTRUMENTS) IMPLANT
DISSECTOR ROUND CHERRY 3/8 STR (MISCELLANEOUS) IMPLANT
DRAPE UNDERBUTTOCKS STRL (DRAPE) ×4 IMPLANT
FLOSEAL 10ML (HEMOSTASIS) ×2 IMPLANT
GAUZE SPONGE 4X4 16PLY XRAY LF (GAUZE/BANDAGES/DRESSINGS) IMPLANT
GLOVE BIO SURGEON STRL SZ 6.5 (GLOVE) ×1 IMPLANT
GLOVE BIO SURGEON STRL SZ7.5 (GLOVE) ×4 IMPLANT
GLOVE BIO SURGEONS STRL SZ 6.5 (GLOVE) ×1
GLOVE BIOGEL PI IND STRL 6.5 (GLOVE) IMPLANT
GLOVE BIOGEL PI IND STRL 7.5 (GLOVE) IMPLANT
GLOVE BIOGEL PI INDICATOR 6.5 (GLOVE) ×2
GLOVE BIOGEL PI INDICATOR 7.5 (GLOVE) ×2
GOWN STRL REUS W/ TWL LRG LVL3 (GOWN DISPOSABLE) ×2 IMPLANT
GOWN STRL REUS W/ TWL XL LVL3 (GOWN DISPOSABLE) ×2 IMPLANT
GOWN STRL REUS W/TWL LRG LVL3 (GOWN DISPOSABLE) ×4
GOWN STRL REUS W/TWL XL LVL3 (GOWN DISPOSABLE) ×2 IMPLANT
KIT ROOM TURNOVER WOR (KITS) ×4 IMPLANT
MANIFOLD NEPTUNE II (INSTRUMENTS) IMPLANT
NDL 1/2 CIR CATGUT .05X1.09 (NEEDLE) IMPLANT
NEEDLE 1/2 CIR CATGUT .05X1.09 (NEEDLE) IMPLANT
NEEDLE HYPO 22GX1.5 SAFETY (NEEDLE) ×8 IMPLANT
PACKING VAGINAL (PACKING) IMPLANT
PENCIL BUTTON HOLSTER BLD 10FT (ELECTRODE) ×4 IMPLANT
PLUG CATH AND CAP STER (CATHETERS) ×4 IMPLANT
RETRACTOR LONRSTAR 16.6X16.6CM (MISCELLANEOUS) ×2 IMPLANT
RETRACTOR STAY HOOK 5MM (MISCELLANEOUS) ×4 IMPLANT
RETRACTOR STER APS 16.6X16.6CM (MISCELLANEOUS) ×4
SET IRRIG Y TYPE TUR BLADDER L (SET/KITS/TRAYS/PACK) ×4 IMPLANT
SHEET LAVH (DRAPES) ×4 IMPLANT
SLING ALTIS SYSTEM (Sling) ×2 IMPLANT
SPONGE LAP 4X18 X RAY DECT (DISPOSABLE) ×4 IMPLANT
SUCTION FRAZIER HANDLE 10FR (MISCELLANEOUS) ×2
SUCTION TUBE FRAZIER 10FR DISP (MISCELLANEOUS) ×2 IMPLANT
SUT ABS MONO DBL WITH NDL 48IN (SUTURE) IMPLANT
SUT CAPIO POLYGLYCOLIC (SUTURE) IMPLANT
SUT ETHILON 2 0 PS N (SUTURE) IMPLANT
SUT MON AB 2-0 SH 27 (SUTURE)
SUT MON AB 2-0 SH27 (SUTURE) IMPLANT
SUT NONABSORB MONO DB W/NDL 48 (SUTURE) IMPLANT
SUT PDS AB 3-0 SH 27 (SUTURE) IMPLANT
SUT VIC AB 0 CT1 36 (SUTURE) IMPLANT
SUT VIC AB 2-0 CT1 27 (SUTURE)
SUT VIC AB 2-0 CT1 TAPERPNT 27 (SUTURE) IMPLANT
SUT VIC AB 2-0 SH 27 (SUTURE) ×4
SUT VIC AB 2-0 SH 27XBRD (SUTURE) ×2 IMPLANT
SUT VIC AB 2-0 UR6 27 (SUTURE) ×4 IMPLANT
SYR BULB IRRIGATION 50ML (SYRINGE) ×6 IMPLANT
SYR CONTROL 10ML LL (SYRINGE) ×4 IMPLANT
SYRINGE 10CC LL (SYRINGE) ×4 IMPLANT
TRAY DSU PREP LF (CUSTOM PROCEDURE TRAY) ×4 IMPLANT
TUBE CONNECTING 12'X1/4 (SUCTIONS) ×2
TUBE CONNECTING 12X1/4 (SUCTIONS) ×6 IMPLANT
WATER STERILE IRR 500ML POUR (IV SOLUTION) ×8 IMPLANT
YANKAUER SUCT BULB TIP NO VENT (SUCTIONS) IMPLANT

## 2016-11-19 NOTE — Anesthesia Postprocedure Evaluation (Addendum)
Anesthesia Post Note  Patient: Felicity PellegriniRuth A Michaelson  Procedure(s) Performed: Procedure(s) (LRB): EXCISION OF PROTRUDING MESH, PUBO-VAGINAL ALTIS SLING (N/A) CYSTOSCOPY (N/A)  Patient location during evaluation: PACU Anesthesia Type: General Level of consciousness: awake and alert Pain management: pain level controlled Vital Signs Assessment: post-procedure vital signs reviewed and stable Respiratory status: spontaneous breathing, nonlabored ventilation, respiratory function stable and patient connected to nasal cannula oxygen Cardiovascular status: blood pressure returned to baseline and stable Postop Assessment: no signs of nausea or vomiting Anesthetic complications: no       Last Vitals:  Vitals:   11/19/16 1245 11/19/16 1300  BP: 125/87 105/60  Pulse: (!) 104 90  Resp: 14 17  Temp:      Last Pain:  Vitals:   11/19/16 0904  TempSrc: Oral                 Shelton SilvasKevin D Shacoya Burkhammer

## 2016-11-19 NOTE — Anesthesia Procedure Notes (Signed)
Procedure Name: LMA Insertion Date/Time: 11/19/2016 10:29 AM Performed by: Jessica PriestBEESON, Bethany Bernath C Pre-anesthesia Checklist: Patient identified, Emergency Drugs available, Suction available and Patient being monitored Patient Re-evaluated:Patient Re-evaluated prior to inductionOxygen Delivery Method: Circle system utilized Preoxygenation: Pre-oxygenation with 100% oxygen Intubation Type: IV induction Ventilation: Mask ventilation without difficulty LMA: LMA inserted LMA Size: 4.0 Number of attempts: 1 Airway Equipment and Method: Bite block Placement Confirmation: positive ETCO2 and breath sounds checked- equal and bilateral Tube secured with: Tape Dental Injury: Teeth and Oropharynx as per pre-operative assessment

## 2016-11-19 NOTE — Interval H&P Note (Signed)
History and Physical Interval Note:  11/19/2016 10:12 AM  Felicity Pellegriniuth A Torain  has presented today for surgery, with the diagnosis of stress incontinence  The various methods of treatment have been discussed with the patient and family. After consideration of risks, benefits and other options for treatment, the patient has consented to  Procedure(s): PUBO-VAGINAL ALTIS SLING (N/A) as a surgical intervention .  The patient's history has been reviewed, patient examined, no change in status, stable for surgery.  I have reviewed the patient's chart and labs.  Questions were answered to the patient's satisfaction.     Allyce Bochicchio I Dezirea Mccollister  Pt seen and surgery discussed again. She again complains of discomfort from previous sling, particularly on the left side, and asks that as much of the prior sling be removed as possible. I will plan for prior sling mesh removal, and Altis sling placement today.

## 2016-11-19 NOTE — Anesthesia Preprocedure Evaluation (Addendum)
Anesthesia Evaluation  Patient identified by MRN, date of birth, ID band Patient awake    Reviewed: Allergy & Precautions, NPO status , Patient's Chart, lab work & pertinent test results  Airway Mallampati: I  TM Distance: >3 FB Neck ROM: Full    Dental  (+) Teeth Intact, Dental Advisory Given   Pulmonary former smoker,    breath sounds clear to auscultation       Cardiovascular hypertension, Pt. on medications negative cardio ROS   Rhythm:Regular Rate:Normal     Neuro/Psych  Headaches, PSYCHIATRIC DISORDERS Anxiety Depression  Neuromuscular disease    GI/Hepatic Neg liver ROS, hiatal hernia, GERD  Medicated,  Endo/Other  negative endocrine ROS  Renal/GU negative Renal ROS  negative genitourinary   Musculoskeletal  (+) Arthritis , Osteoarthritis,    Abdominal   Peds negative pediatric ROS (+)  Hematology negative hematology ROS (+)   Anesthesia Other Findings   Reproductive/Obstetrics negative OB ROS                            Lab Results  Component Value Date   WBC 8.6 04/19/2012   HGB 10.6 (L) 04/19/2012   HCT 33.0 (L) 04/19/2012   MCV 86.2 04/19/2012   PLT 228 04/19/2012   Lab Results  Component Value Date   CREATININE 0.69 04/19/2012   BUN 15 04/19/2012   NA 141 04/19/2012   K 3.5 04/19/2012   CL 106 04/19/2012   CO2 28 04/19/2012   Lab Results  Component Value Date   INR 1.04 04/10/2012   INR 1.02 02/16/2012   11/2016 EKG: normal sinus rhythm.  Anesthesia Physical Anesthesia Plan  ASA: II  Anesthesia Plan: General   Post-op Pain Management:    Induction: Intravenous  Airway Management Planned: LMA  Additional Equipment:   Intra-op Plan:   Post-operative Plan: Extubation in OR  Informed Consent: I have reviewed the patients History and Physical, chart, labs and discussed the procedure including the risks, benefits and alternatives for the proposed  anesthesia with the patient or authorized representative who has indicated his/her understanding and acceptance.   Dental advisory given  Plan Discussed with: CRNA  Anesthesia Plan Comments:         Anesthesia Quick Evaluation

## 2016-11-19 NOTE — Discharge Instructions (Addendum)
Urinary Incontinence Urinary incontinence is the involuntary loss of urine from your bladder. CAUSES  There are many causes of urinary incontinence. They include:  Medicines.  Infections.  Prostatic enlargement, leading to overflow of urine from your bladder.  Surgery.  Neurological diseases.  Emotional factors. SIGNS AND SYMPTOMS Urinary Incontinence can be divided into four types: 1. Urge incontinence. Urge incontinence is the involuntary loss of urine before you have the opportunity to go to the bathroom. There is a sudden urge to void but not enough time to reach a bathroom. 2. Stress incontinence. Stress incontinence is the sudden loss of urine with any activity that forces urine to pass. It is commonly caused by anatomical changes to the pelvis and sphincter areas of your body. 3. Overflow incontinence. Overflow incontinence is the loss of urine from an obstructed opening to your bladder. This results in a backup of urine and a resultant buildup of pressure within the bladder. When the pressure within the bladder exceeds the closing pressure of the sphincter, the urine overflows, which causes incontinence, similar to water overflowing a dam. 4. Total incontinence. Total incontinence is the loss of urine as a result of the inability to store urine within your bladder. DIAGNOSIS  Evaluating the cause of incontinence may require:  A thorough and complete medical and obstetric history.  A complete physical exam.  Laboratory tests such as a urine culture and sensitivities. When additional tests are indicated, they can include:  An ultrasound exam.  Kidney and bladder X-rays.  Cystoscopy. This is an exam of the bladder using a narrow scope.  Urodynamic testing to test the nerve function to the bladder and sphincter areas. TREATMENT  Treatment for urinary incontinence depends on the cause:  For urge incontinence caused by a bacterial infection, antibiotics will be prescribed.  If the urge incontinence is related to medicines you take, your health care provider may have you change the medicine.  For stress incontinence, surgery to re-establish anatomical support to the bladder or sphincter, or both, will often correct the condition.  For overflow incontinence caused by an enlarged prostate, an operation to open the channel through the enlarged prostate will allow the flow of urine out of the bladder. In women with fibroids, a hysterectomy may be recommended.  For total incontinence, surgery on your urinary sphincter may help. An artificial urinary sphincter (an inflatable cuff placed around the urethra) may be required. In women who have developed a hole-like passage between their bladder and vagina (vesicovaginal fistula), surgery to close the fistula often is required. HOME CARE INSTRUCTIONS  Normal daily hygiene and the use of pads or adult diapers that are changed regularly will help prevent odors and skin damage.  Avoid caffeine. It can overstimulate your bladder.  Use the bathroom regularly. Try about every 2-3 hours to go to the bathroom, even if you do not feel the need to do so. Take time to empty your bladder completely. After urinating, wait a minute. Then try to urinate again.  For causes involving nerve dysfunction, keep a log of the medicines you take and a journal of the times you go to the bathroom. SEEK MEDICAL CARE IF:  You experience worsening of pain instead of improvement in pain after your procedure.  Your incontinence becomes worse instead of better. SEE IMMEDIATE MEDICAL CARE IF:  You experience fever or shaking chills.  You are unable to pass your urine.  You have redness spreading into your groin or down into your thighs. MAKE SURE  YOU:  ?Understand these instructions.   Will watch your condition.  Will get help right away if you are not doing well or get worse. This information is not intended to replace advice given to you by  your health care provider. Make sure you discuss any questions you have with your health care provider. Document Released: 12/02/2004 Document Revised: 11/15/2014 Document Reviewed: 04/03/2013 Elsevier Interactive Patient Education  2017 [employer]  HOME CARE INSTRUCTIONS FOR VAGINAL SLING  Activity:  -No lifting greater than 10-15 pounds for 1 week, or as instructed by your   physician.             -No sexual intercourse until your f/u visit Diet:  You may return to your normal diet tomorrow.   It is important to keep your   bowels regular during the postoperative period.  To avoid constipation, drink plenty of fluids during the day (8-10 glasses) and eat plenty of fresh fruits and vegetables.  Use a mild laxative or stool softener if necessary.  Wound Care:  You may begin showering tomorrow, or as instructed by your physician.       Return to Work as instructed by your physician.    Special Instructions:   Call your physician if any of these symptoms occur:   -temperature greater than 101 degrees Farenheit.   -redness, swelling or drainage at incision site.   -foul odor of your urine.   -a significant decrease in the amount of urine you have every day.   -severe pain not relieved by your pain medication.    Return to see your doctor .   Call to set up a follow-up appointment.   Patient Signature:  ________________________________________________________  Nurse's Signature:  ________________________________________________________   Post Anesthesia Home Care Instructions  Activity: Get plenty of rest for the remainder of the day. A responsible adult should stay with you for 24 hours following the procedure.  For the next 24 hours, DO NOT: -Drive a car -Advertising copywriterperate machinery -Drink alcoholic beverages -Take any medication unless instructed by your physician -Make any legal decisions or sign important papers.  Meals: Start with liquid foods such as gelatin or soup.  Progress to regular foods as tolerated. Avoid greasy, spicy, heavy foods. If nausea and/or vomiting occur, drink only clear liquids until the nausea and/or vomiting subsides. Call your physician if vomiting continues.  Special Instructions/Symptoms: Your throat may feel dry or sore from the anesthesia or the breathing tube placed in your throat during surgery. If this causes discomfort, gargle with warm salt water. The discomfort should disappear within 24 hours.  If you had a scopolamine patch placed behind your ear for the management of post- operative nausea and/or vomiting:  1. The medication in the patch is effective for 72 hours, after which it should be removed.  Wrap patch in a tissue and discard in the trash. Wash hands thoroughly with soap and water. 2. You may remove the patch earlier than 72 hours if you experience unpleasant side effects which may include dry mouth, dizziness or visual disturbances. 3. Avoid touching the patch. Wash your hands with soap and water after contact with the patch.

## 2016-11-19 NOTE — Transfer of Care (Signed)
Immediate Anesthesia Transfer of Care Note  Patient: [name]  Procedure(s) Performed: Procedure(s) (LRB): EXCISION OF PROTRUDING MESH, PUBO-VAGINAL ALTIS SLING (N/A) CYSTOSCOPY (N/A)  Patient Location: PACU  Anesthesia Type: General  Level of Consciousness: awake, sedated, patient cooperative and responds to stimulation  Airway & Oxygen Therapy: Patient Spontanous Breathing and Patient connected to [place] O2  Post-op Assessment: Report given to PACU RN, Post -op Vital signs reviewed and stable and Patient moving all extremities  Post vital signs: Reviewed and stable  Complications: No apparent anesthesia complications

## 2016-11-19 NOTE — Op Note (Signed)
Pre-operative diagnosis :  Vaginal sling extrusion, and Stress Urinary Incontinence  Postoperative diagnosis: Same  Operation: Peri-urethral exploration, excision of extruded TOT mesh; placement of Coloplast Altis sling.  Surgeon:  Chauncey Cruel. Gaynelle Arabian, MD  First assistant:  None  Anesthesia:  General LMA  Preparation:  After appropriate preanesthesia, the patient was brought the operative room, placed on the operating table in the dorsal supine position where general LMA anesthesia was introduced. She was then replaced in dorsal lithotomy position with pubis was prepped with Betadine solution and draped in usual fashion. History was reviewed. Pelvic examination was accomplished, and it is noted that the patient complained specifically of tethering of tissue in the left anterior vaginal vault, and this is palpated, and felt to be extrusion of TOT tape. This will be removed.  Review history:   She returns today to discuss the sling procedure. She has done multiple sessions of PT, but did not find it effective. Previously discussed alternatives for treatment, including injection therapy, and mini-sling. However, I think she will need exploration, and removal of her prior sling, with care taken to avoid urethral disruption, and urethrolysis with exploration to find probable suture above the urethra. She will then need a new sling-probable fascia.   Video urodynamics is accomplished on 09/16/15, in sitting position. The patient appears to have stress urinary incontinence with a maximum capacity of 230 cc. She has mild low amplitude instability, which she can inhibit. There is no reflux. She does empty efficiently, leaving a residual of 20-30 cc.   She is status post pubovaginal sling in 2008 per gynecology. She had postop urethral vaginal extrusion, with surgical excision of a corner of the sling. She currently feels as if there is "fishing line around the clitoris".  Pt successfully  treated with pelvic flor  Physical therapy prior to surgical excision of extruding mesh and Altis sling.   Statement of  Likelihood of Success: Excellent. TIME-OUT observed.:  Procedure:  Foley catheter was placed in the bladder, and 10 mL of sterile water was placed in the balloon. The bladder neck was marked with a blue marking pen, and mid urethra was also marked. 10 mL of lidocaine 1% with epinephrine 1-200,000 was injected in the periurethral space.    A 2 cm incision is made mid urethra, and subcuticular tissue was dissected. Immediately, scar was identified, and careful dissection was accomplished in order to avoid injury to the urethra. The TVT tape was identified, and as much as possible was removed. Dissection was accomplished to the pubis, so that the Altis sling could be placed.   On the left side, more dissection was accomplished, an order to remove the TOT tape. It was necessary to make relaxing vaginal incisions, in order to relax scar tissue in the lateral portion of the vagina, in the anterior portion of the vagina. Following this dissection, I was able to dissect under the pubis, and then placed the alto's sling in classic fashion, with no difficulty. The sling was smoothed and loose against the urethra. Cystoscopy showed no abnormality of the bladder, and Flor seen and dye was given. Urine was seen to be met from both ureteral orifices. 500 mL of water was left in the bladder and Foley was replaced with a plug in the catheter.  The Foley was removed temporarily, and pressure was placed in the suprapubic area, in order to have the patient leak. Tensioning was accomplished, and then the sling was checked to be sure it was still loose against  the urethra, and smooth against the urethra. Foley was replaced with the plug in place. Irrigation was accomplished. The wound was closed with running 3-0 Vicryl suture, with a single 2-0 Vicryl subcutaneous suture. The left lateral incisions were closed with 3-0 Vicryl  interrupted suture. Prior to closure of the wound, I felt there was some bleeding from dissection, and elected to place FloSeal in the wound, and packed the wound for 4 minutes prior to closure.  At the end of the procedure, I elected to pack the wound for 1 hour. Foley catheter was removed.  The patient was given IV Toradol, and awakened and taken to recovery room in good condition.

## 2016-11-22 ENCOUNTER — Encounter (HOSPITAL_BASED_OUTPATIENT_CLINIC_OR_DEPARTMENT_OTHER): Payer: Self-pay | Admitting: Urology

## 2017-04-08 NOTE — Addendum Note (Signed)
Addendum  created 04/08/17 0851 by Sylvana Bonk D, MD   Sign clinical note    

## 2019-07-30 ENCOUNTER — Other Ambulatory Visit: Payer: Self-pay | Admitting: Internal Medicine

## 2019-07-30 DIAGNOSIS — Z1231 Encounter for screening mammogram for malignant neoplasm of breast: Secondary | ICD-10-CM

## 2019-08-15 ENCOUNTER — Other Ambulatory Visit: Payer: Self-pay

## 2019-08-15 DIAGNOSIS — Z20822 Contact with and (suspected) exposure to covid-19: Secondary | ICD-10-CM

## 2019-08-16 LAB — NOVEL CORONAVIRUS, NAA: SARS-CoV-2, NAA: NOT DETECTED

## 2019-09-13 ENCOUNTER — Ambulatory Visit
Admission: RE | Admit: 2019-09-13 | Discharge: 2019-09-13 | Disposition: A | Discharge status: Home / Self Care | Payer: Medicare Other | Source: Ambulatory Visit | Attending: Internal Medicine | Admitting: Internal Medicine

## 2019-09-13 ENCOUNTER — Other Ambulatory Visit: Payer: Self-pay

## 2019-09-13 DIAGNOSIS — Z1231 Encounter for screening mammogram for malignant neoplasm of breast: Secondary | ICD-10-CM

## 2020-10-15 ENCOUNTER — Emergency Department (HOSPITAL_COMMUNITY)
Admission: EM | Admit: 2020-10-15 | Discharge: 2020-10-15 | Discharge status: Against Medical Advice | Payer: Medicare Other | Attending: Emergency Medicine | Admitting: Emergency Medicine

## 2020-10-15 ENCOUNTER — Other Ambulatory Visit: Payer: Self-pay

## 2020-10-15 MED ORDER — ONDANSETRON 4 MG PO TBDP: 4.0000 mg | ORAL_TABLET | Freq: Once | ORAL | Status: DC | PRN

## 2020-10-20 ENCOUNTER — Encounter (HOSPITAL_COMMUNITY): Payer: Self-pay

## 2020-10-20 ENCOUNTER — Emergency Department (HOSPITAL_COMMUNITY)
Admission: EM | Admit: 2020-10-20 | Discharge: 2020-10-20 | Disposition: A | Discharge status: Home / Self Care | Payer: Medicare Other | Source: Home / Self Care

## 2020-10-20 ENCOUNTER — Other Ambulatory Visit: Payer: Self-pay

## 2020-10-20 DIAGNOSIS — N179 Acute kidney failure, unspecified: Secondary | ICD-10-CM | POA: Diagnosis not present

## 2020-10-20 DIAGNOSIS — N132 Hydronephrosis with renal and ureteral calculous obstruction: Secondary | ICD-10-CM | POA: Diagnosis not present

## 2020-10-20 DIAGNOSIS — R112 Nausea with vomiting, unspecified: Secondary | ICD-10-CM | POA: Insufficient documentation

## 2020-10-20 DIAGNOSIS — R1032 Left lower quadrant pain: Secondary | ICD-10-CM | POA: Insufficient documentation

## 2020-10-20 DIAGNOSIS — Z5321 Procedure and treatment not carried out due to patient leaving prior to being seen by health care provider: Secondary | ICD-10-CM | POA: Insufficient documentation

## 2020-10-20 DIAGNOSIS — R799 Abnormal finding of blood chemistry, unspecified: Secondary | ICD-10-CM | POA: Insufficient documentation

## 2020-10-20 LAB — COMPREHENSIVE METABOLIC PANEL
ALT: 9 U/L (ref 0–44)
AST: 14 U/L — ABNORMAL LOW (ref 15–41)
Albumin: 4 g/dL (ref 3.5–5.0)
Alkaline Phosphatase: 87 U/L (ref 38–126)
Anion gap: 13 (ref 5–15)
BUN: 56 mg/dL — ABNORMAL HIGH (ref 8–23)
CO2: 24 mmol/L (ref 22–32)
Calcium: 9.2 mg/dL (ref 8.9–10.3)
Chloride: 99 mmol/L (ref 98–111)
Creatinine, Ser: 5.76 mg/dL — ABNORMAL HIGH (ref 0.44–1.00)
GFR, Estimated: 7 mL/min — ABNORMAL LOW (ref >60)
Glucose, Bld: 129 mg/dL — ABNORMAL HIGH (ref 70–99)
Potassium: 3.5 mmol/L (ref 3.5–5.1)
Sodium: 136 mmol/L (ref 135–145)
Total Bilirubin: 0.6 mg/dL (ref 0.3–1.2)
Total Protein: 7.5 g/dL (ref 6.5–8.1)

## 2020-10-20 LAB — CBC
HCT: 41.1 % (ref 36.0–46.0)
Hemoglobin: 13.4 g/dL (ref 12.0–15.0)
MCH: 28 pg (ref 26.0–34.0)
MCHC: 32.6 g/dL (ref 30.0–36.0)
MCV: 85.8 fL (ref 80.0–100.0)
Platelets: 365 10*3/uL (ref 150–400)
RBC: 4.79 MIL/uL (ref 3.87–5.11)
RDW: 14.5 % (ref 11.5–15.5)
WBC: 11.1 10*3/uL — ABNORMAL HIGH (ref 4.0–10.5)
nRBC: 0 % (ref 0.0–0.2)

## 2020-10-20 LAB — LIPASE, BLOOD: Lipase: 45 U/L (ref 11–51)

## 2020-10-20 NOTE — ED Notes (Signed)
Pt returned labels and eloped from waiting area. Unable to speak with patient.

## 2020-10-20 NOTE — ED Triage Notes (Signed)
Patient was sent to the ED for a creatine level-1.65 and a WBC-13.2.  Patient c/o LLQ pain, N/v x 2 days.

## 2020-10-21 ENCOUNTER — Encounter (HOSPITAL_COMMUNITY)
Admission: EM | Disposition: A | Discharge status: Home / Self Care | Payer: Self-pay | Source: Home / Self Care | Attending: Internal Medicine

## 2020-10-21 ENCOUNTER — Inpatient Hospital Stay (HOSPITAL_COMMUNITY): Payer: Medicare Other

## 2020-10-21 ENCOUNTER — Inpatient Hospital Stay (HOSPITAL_COMMUNITY)
Admission: EM | Admit: 2020-10-21 | Discharge: 2020-10-23 | DRG: 661 | Disposition: A | Discharge status: Home / Self Care | Payer: Medicare Other | Attending: Internal Medicine | Admitting: Internal Medicine

## 2020-10-21 ENCOUNTER — Inpatient Hospital Stay (HOSPITAL_COMMUNITY): Payer: Medicare Other | Admitting: Anesthesiology

## 2020-10-21 ENCOUNTER — Other Ambulatory Visit: Payer: Self-pay

## 2020-10-21 ENCOUNTER — Encounter (HOSPITAL_COMMUNITY): Payer: Self-pay

## 2020-10-21 DIAGNOSIS — K219 Gastro-esophageal reflux disease without esophagitis: Secondary | ICD-10-CM | POA: Diagnosis present

## 2020-10-21 DIAGNOSIS — F419 Anxiety disorder, unspecified: Secondary | ICD-10-CM | POA: Diagnosis present

## 2020-10-21 DIAGNOSIS — E669 Obesity, unspecified: Secondary | ICD-10-CM | POA: Diagnosis present

## 2020-10-21 DIAGNOSIS — Z96653 Presence of artificial knee joint, bilateral: Secondary | ICD-10-CM | POA: Diagnosis present

## 2020-10-21 DIAGNOSIS — Z885 Allergy status to narcotic agent status: Secondary | ICD-10-CM

## 2020-10-21 DIAGNOSIS — Z8249 Family history of ischemic heart disease and other diseases of the circulatory system: Secondary | ICD-10-CM | POA: Diagnosis not present

## 2020-10-21 DIAGNOSIS — D72829 Elevated white blood cell count, unspecified: Secondary | ICD-10-CM | POA: Diagnosis present

## 2020-10-21 DIAGNOSIS — Z83511 Family history of glaucoma: Secondary | ICD-10-CM | POA: Diagnosis not present

## 2020-10-21 DIAGNOSIS — N179 Acute kidney failure, unspecified: Secondary | ICD-10-CM | POA: Diagnosis present

## 2020-10-21 DIAGNOSIS — Z87442 Personal history of urinary calculi: Secondary | ICD-10-CM

## 2020-10-21 DIAGNOSIS — K449 Diaphragmatic hernia without obstruction or gangrene: Secondary | ICD-10-CM | POA: Diagnosis present

## 2020-10-21 DIAGNOSIS — E785 Hyperlipidemia, unspecified: Secondary | ICD-10-CM | POA: Diagnosis present

## 2020-10-21 DIAGNOSIS — Z6837 Body mass index (BMI) 37.0-37.9, adult: Secondary | ICD-10-CM

## 2020-10-21 DIAGNOSIS — Z8261 Family history of arthritis: Secondary | ICD-10-CM

## 2020-10-21 DIAGNOSIS — Z87891 Personal history of nicotine dependence: Secondary | ICD-10-CM | POA: Diagnosis not present

## 2020-10-21 DIAGNOSIS — N132 Hydronephrosis with renal and ureteral calculous obstruction: Secondary | ICD-10-CM | POA: Diagnosis present

## 2020-10-21 DIAGNOSIS — F418 Other specified anxiety disorders: Secondary | ICD-10-CM | POA: Diagnosis present

## 2020-10-21 DIAGNOSIS — N201 Calculus of ureter: Secondary | ICD-10-CM | POA: Diagnosis not present

## 2020-10-21 DIAGNOSIS — Z8719 Personal history of other diseases of the digestive system: Secondary | ICD-10-CM

## 2020-10-21 DIAGNOSIS — E876 Hypokalemia: Secondary | ICD-10-CM | POA: Diagnosis present

## 2020-10-21 DIAGNOSIS — Z20822 Contact with and (suspected) exposure to covid-19: Secondary | ICD-10-CM | POA: Diagnosis present

## 2020-10-21 DIAGNOSIS — Z9049 Acquired absence of other specified parts of digestive tract: Secondary | ICD-10-CM

## 2020-10-21 DIAGNOSIS — I10 Essential (primary) hypertension: Secondary | ICD-10-CM | POA: Diagnosis present

## 2020-10-21 DIAGNOSIS — Z791 Long term (current) use of non-steroidal anti-inflammatories (NSAID): Secondary | ICD-10-CM

## 2020-10-21 DIAGNOSIS — F32A Depression, unspecified: Secondary | ICD-10-CM | POA: Diagnosis present

## 2020-10-21 DIAGNOSIS — Z882 Allergy status to sulfonamides status: Secondary | ICD-10-CM

## 2020-10-21 DIAGNOSIS — Z79899 Other long term (current) drug therapy: Secondary | ICD-10-CM

## 2020-10-21 DIAGNOSIS — N1330 Unspecified hydronephrosis: Secondary | ICD-10-CM | POA: Diagnosis not present

## 2020-10-21 HISTORY — PX: CYSTOSCOPY W/ URETERAL STENT PLACEMENT: SHX1429

## 2020-10-21 LAB — URINALYSIS, ROUTINE W REFLEX MICROSCOPIC
Bilirubin Urine: NEGATIVE
Glucose, UA: NEGATIVE mg/dL
Ketones, ur: NEGATIVE mg/dL
Nitrite: NEGATIVE
Protein, ur: NEGATIVE mg/dL
Specific Gravity, Urine: 1.014 (ref 1.005–1.030)
pH: 5 (ref 5.0–8.0)

## 2020-10-21 LAB — HIV ANTIBODY (ROUTINE TESTING W REFLEX): HIV Screen 4th Generation wRfx: NONREACTIVE

## 2020-10-21 LAB — RESP PANEL BY RT-PCR (FLU A&B, COVID) ARPGX2
Influenza A by PCR: NEGATIVE
Influenza B by PCR: NEGATIVE
SARS Coronavirus 2 by RT PCR: NEGATIVE

## 2020-10-21 LAB — SODIUM, URINE, RANDOM: Sodium, Ur: 52 mmol/L

## 2020-10-21 LAB — CREATININE, URINE, RANDOM: Creatinine, Urine: 236.14 mg/dL

## 2020-10-21 SURGERY — CYSTOSCOPY, WITH RETROGRADE PYELOGRAM AND URETERAL STENT INSERTION
Anesthesia: General | Site: Ureter | Laterality: Left

## 2020-10-21 MED ORDER — LIDOCAINE HCL (PF) 2 % IJ SOLN
INTRAMUSCULAR | Status: AC
  Filled 2020-10-21: qty 5

## 2020-10-21 MED ORDER — MIDAZOLAM HCL 2 MG/2ML IJ SOLN
INTRAMUSCULAR | Status: DC | PRN
  Administered 2020-10-21: 2 mg via INTRAVENOUS

## 2020-10-21 MED ORDER — ACETAMINOPHEN 325 MG PO TABS: 650.0000 mg | ORAL_TABLET | Freq: Four times a day (QID) | ORAL | Status: DC | PRN

## 2020-10-21 MED ORDER — SODIUM CHLORIDE 0.9 % IV BOLUS
1000.0000 mL | Freq: Once | INTRAVENOUS | Status: AC
  Administered 2020-10-21: 02:00:00 1000 mL via INTRAVENOUS

## 2020-10-21 MED ORDER — FENTANYL CITRATE (PF) 100 MCG/2ML IJ SOLN
INTRAMUSCULAR | Status: DC | PRN
  Administered 2020-10-21 (×2): 25 ug via INTRAVENOUS

## 2020-10-21 MED ORDER — LACTATED RINGERS IV SOLN: Freq: Once | INTRAVENOUS | Status: AC

## 2020-10-21 MED ORDER — MIDAZOLAM HCL 2 MG/2ML IJ SOLN
INTRAMUSCULAR | Status: AC
  Filled 2020-10-21: qty 2

## 2020-10-21 MED ORDER — SODIUM CHLORIDE 0.9 % IV SOLN: INTRAVENOUS | Status: DC

## 2020-10-21 MED ORDER — ONDANSETRON HCL 4 MG/2ML IJ SOLN: 4.0000 mg | Freq: Once | INTRAMUSCULAR | Status: DC | PRN

## 2020-10-21 MED ORDER — DEXAMETHASONE SODIUM PHOSPHATE 10 MG/ML IJ SOLN
INTRAMUSCULAR | Status: AC
  Filled 2020-10-21: qty 1

## 2020-10-21 MED ORDER — DEXAMETHASONE SODIUM PHOSPHATE 10 MG/ML IJ SOLN
INTRAMUSCULAR | Status: DC | PRN
  Administered 2020-10-21: 4 mg via INTRAVENOUS

## 2020-10-21 MED ORDER — ONDANSETRON HCL 4 MG PO TABS: 4.0000 mg | ORAL_TABLET | Freq: Four times a day (QID) | ORAL | Status: DC | PRN

## 2020-10-21 MED ORDER — HYDRALAZINE HCL 20 MG/ML IJ SOLN: 10.0000 mg | Freq: Three times a day (TID) | INTRAMUSCULAR | Status: DC | PRN

## 2020-10-21 MED ORDER — FENTANYL CITRATE (PF) 100 MCG/2ML IJ SOLN: 25.0000 ug | INTRAMUSCULAR | Status: DC | PRN

## 2020-10-21 MED ORDER — PROPOFOL 10 MG/ML IV BOLUS
INTRAVENOUS | Status: AC
  Filled 2020-10-21: qty 20

## 2020-10-21 MED ORDER — PROPOFOL 10 MG/ML IV BOLUS
INTRAVENOUS | Status: DC | PRN
  Administered 2020-10-21: 120 mg via INTRAVENOUS

## 2020-10-21 MED ORDER — CHLORHEXIDINE GLUCONATE CLOTH 2 % EX PADS
6.0000 | MEDICATED_PAD | Freq: Every day | CUTANEOUS | Status: DC
  Administered 2020-10-21 – 2020-10-22 (×2): 6 via TOPICAL

## 2020-10-21 MED ORDER — SODIUM CHLORIDE 0.9 % IR SOLN
Status: DC | PRN
  Administered 2020-10-21: 3000 mL

## 2020-10-21 MED ORDER — LIDOCAINE 2% (20 MG/ML) 5 ML SYRINGE
INTRAMUSCULAR | Status: DC | PRN
  Administered 2020-10-21: 60 mg via INTRAVENOUS

## 2020-10-21 MED ORDER — FENTANYL CITRATE (PF) 100 MCG/2ML IJ SOLN: 12.5000 ug | INTRAMUSCULAR | Status: DC | PRN

## 2020-10-21 MED ORDER — SODIUM CHLORIDE 0.9 % IV SOLN
1.0000 g | INTRAVENOUS | Status: DC
  Administered 2020-10-22: 05:00:00 1 g via INTRAVENOUS
  Filled 2020-10-21: qty 1

## 2020-10-21 MED ORDER — CITALOPRAM HYDROBROMIDE 20 MG PO TABS
40.0000 mg | ORAL_TABLET | Freq: Every morning | ORAL | Status: DC
  Administered 2020-10-21 – 2020-10-22 (×2): 40 mg via ORAL
  Filled 2020-10-21 (×2): qty 2

## 2020-10-21 MED ORDER — ONDANSETRON HCL 4 MG/2ML IJ SOLN
INTRAMUSCULAR | Status: AC
  Filled 2020-10-21: qty 2

## 2020-10-21 MED ORDER — SODIUM CHLORIDE 0.9 % IV SOLN
1.0000 g | Freq: Once | INTRAVENOUS | Status: AC
  Administered 2020-10-21: 07:00:00 1 g via INTRAVENOUS
  Filled 2020-10-21: qty 10

## 2020-10-21 MED ORDER — ACETAMINOPHEN 650 MG RE SUPP: 650.0000 mg | Freq: Four times a day (QID) | RECTAL | Status: DC | PRN

## 2020-10-21 MED ORDER — IOHEXOL 300 MG/ML  SOLN
INTRAMUSCULAR | Status: DC | PRN
  Administered 2020-10-21: 16:00:00 10 mL

## 2020-10-21 MED ORDER — FENTANYL CITRATE (PF) 100 MCG/2ML IJ SOLN
INTRAMUSCULAR | Status: AC
  Filled 2020-10-21: qty 2

## 2020-10-21 MED ORDER — LACTATED RINGERS IV SOLN: INTRAVENOUS | Status: DC | PRN

## 2020-10-21 MED ORDER — CELECOXIB 200 MG PO CAPS
200.0000 mg | ORAL_CAPSULE | Freq: Every morning | ORAL | Status: DC
  Administered 2020-10-22: 10:00:00 200 mg via ORAL
  Filled 2020-10-21: qty 1

## 2020-10-21 MED ORDER — CHLORHEXIDINE GLUCONATE 0.12 % MT SOLN
15.0000 mL | OROMUCOSAL | Status: AC
  Administered 2020-10-21: 15:00:00 15 mL via OROMUCOSAL

## 2020-10-21 MED ORDER — ATORVASTATIN CALCIUM 10 MG PO TABS
10.0000 mg | ORAL_TABLET | Freq: Every day | ORAL | Status: DC
  Administered 2020-10-21 – 2020-10-23 (×3): 10 mg via ORAL
  Filled 2020-10-21 (×3): qty 1

## 2020-10-21 MED ORDER — ONDANSETRON HCL 4 MG/2ML IJ SOLN
INTRAMUSCULAR | Status: DC | PRN
  Administered 2020-10-21: 4 mg via INTRAVENOUS

## 2020-10-21 MED ORDER — ONDANSETRON HCL 4 MG/2ML IJ SOLN: 4.0000 mg | Freq: Four times a day (QID) | INTRAMUSCULAR | Status: DC | PRN

## 2020-10-21 SURGICAL SUPPLY — 16 items
BAG URO CATCHER STRL LF (MISCELLANEOUS) ×3 IMPLANT
BASKET ZERO TIP NITINOL 2.4FR (BASKET) IMPLANT
BSKT STON RTRVL ZERO TP 2.4FR (BASKET)
CATH INTERMIT  6FR 70CM (CATHETERS) ×2 IMPLANT
CLOTH BEACON ORANGE TIMEOUT ST (SAFETY) ×3 IMPLANT
GLOVE BIOGEL M STRL SZ7.5 (GLOVE) ×3 IMPLANT
GOWN STRL REUS W/TWL LRG LVL3 (GOWN DISPOSABLE) ×3 IMPLANT
GUIDEWIRE ANG ZIPWIRE 038X150 (WIRE) ×3 IMPLANT
GUIDEWIRE STR DUAL SENSOR (WIRE) IMPLANT
KIT TURNOVER KIT A (KITS) ×2 IMPLANT
MANIFOLD NEPTUNE II (INSTRUMENTS) ×3 IMPLANT
PACK CYSTO (CUSTOM PROCEDURE TRAY) ×3 IMPLANT
STENT POLARIS 5FRX24 (STENTS) ×2 IMPLANT
TUBING CONNECTING 10 (TUBING) ×2 IMPLANT
TUBING CONNECTING 10' (TUBING) ×1
TUBING UROLOGY SET (TUBING) IMPLANT

## 2020-10-21 NOTE — Anesthesia Procedure Notes (Signed)
Procedure Name: LMA Insertion Date/Time: 10/21/2020 4:02 PM Performed by: Nelle Don, CRNA Pre-anesthesia Checklist: Patient identified, Emergency Drugs available, Suction available and Patient being monitored Patient Re-evaluated:Patient Re-evaluated prior to induction Oxygen Delivery Method: Circle system utilized Induction Type: IV induction Ventilation: Mask ventilation without difficulty LMA: LMA inserted LMA Size: 4.0 Number of attempts: 1 Dental Injury: Teeth and Oropharynx as per pre-operative assessment

## 2020-10-21 NOTE — Brief Op Note (Signed)
10/21/2020  4:22 PM  PATIENT:  [name]  69 y.o. female  PRE-OPERATIVE DIAGNOSIS:  acute renal failure  POST-OPERATIVE DIAGNOSIS:  * No post-op diagnosis entered *  PROCEDURE:  Procedure(s): CYSTOSCOPY WITH RETROGRADE PYELOGRAM/URETERAL STENT PLACEMENT (Right)  SURGEON:  Surgeon(s) and Role:    * [name], MD - Primary  PHYSICIAN ASSISTANT:   ASSISTANTS: none   ANESTHESIA:   general  EBL:  minimal   BLOOD ADMINISTERED:none  DRAINS: foley to gravity   LOCAL MEDICATIONS USED:  NONE  SPECIMEN:  No Specimen  DISPOSITION OF SPECIMEN:  N/A  COUNTS:  YES  TOURNIQUET:  * No tourniquets in log *  DICTATION: .Other Dictation: Dictation Number [id number]  PLAN OF CARE: Admit to inpatient   PATIENT DISPOSITION:  PACU - hemodynamically stable.   Delay start of Pharmacological VTE agent (>24hrs) due to surgical blood loss or risk of bleeding: yes

## 2020-10-21 NOTE — H&P (Signed)
History and Physical    [name] XLK:[id number] DOB: 1951-05-23 DOA: 10/21/2020  PCP: Kirby Funk, MD  Patient coming from: Home  Chief Complaint: lab abnormalities  HPI: [name] is a 69 y.o. female with medical history significant of HTN, depression, renal stones. Presenting at the instruction of her PCP d/t elevated creatinine. Poor historian. She reports that she had some LLQ abdominal and back pain over the weekend. Tried OTC pain meds and rest, but it did not help. She states that she had some N/V for those two days and poor appetite since. She went to her PCP for a physical and labwork. She was notified that her creatinine was quite elevated and she should go to the ED. She denies any aggravating or alleviating factors.   ED Course: Scr was found to be 5.76. CT renal study showed left pnephrolithiasis w/ 50mm proximal left ureteral stone and moderate left hydronephrosis. Urology was consulted. [facility] was called for admission.   Review of Systems:  Denies fevers, CP, dyspnea, diarrhea, urinary symptoms. Review of systems is otherwise negative for all not mentioned in HPI.   PMHx Past Medical History:  Diagnosis Date  . Anxiety   . Bursitis   . Depression   . Family history of adverse reaction to anesthesia    mother-- ponv  . GERD (gastroesophageal reflux disease)   . Hiatal hernia   . History of colon polyps    2003  hyperplastic  . History of kidney stones   . Hypertension   . Migraine   . OA (osteoarthritis)   . Renal disorder   . Sciatica, right side   . SUI (stress urinary incontinence, female)   . Wears glasses     PSHx Past Surgical History:  Procedure Laterality Date  . ANKLE ARTHROSCOPY Right 2014  . CARPAL TUNNEL RELEASE Bilateral left 08-03-2007;   right 09-08-2007  . COLONOSCOPY WITH PROPOFOL N/A 04/10/2013   Procedure: COLONOSCOPY WITH PROPOFOL;  Surgeon: Charolett Bumpers, MD;  Location: [facility];  Service: Endoscopy;  Laterality: N/A;  .  CYSTO/  RIGHT URETERAL STENT PLACEMENT  05/18/2005  . CYSTOSCOPY N/A 11/19/2016   Procedure: CYSTOSCOPY;  Surgeon: Jethro Bolus, MD;  Location: [facility];  Service: Urology;  Laterality: N/A;  . EXTRACORPOREAL SHOCK WAVE LITHOTRIPSY  x2  . LAPAROSCOPIC APPENDECTOMY  04/03/2007  . NASAL SEPTUM SURGERY  1995  . POSTERIOR REPAIR /  TRANSVAGINAL TAPE Secure PLACEMENT  08/23/2007  . PUBOVAGINAL SLING N/A 11/19/2016   Procedure: EXCISION OF PROTRUDING MESH, PUBO-VAGINAL ALTIS SLING;  Surgeon: Jethro Bolus, MD;  Location: [facility];  Service: Urology;  Laterality: N/A;  . REMOVAL MID PORTION TVT TAPE  09/02/2008  . RIGHT LONG FINGER EXPLORATION/ DEBRIDEMENT FORGEIGN BODY GRANULOMA AND BX  10/20/2001  . SHOULDER ARTHROSCOPY W/ ROTATOR CUFF REPAIR Left 07/26/2005   and Left Total Knee manipulation  . TONSILLECTOMY  child  . TOTAL KNEE ARTHROPLASTY  04/17/2012   Procedure: TOTAL KNEE ARTHROPLASTY;  Surgeon: Nilda Simmer, MD;  Location: [facility];  Service: Orthopedics;  Laterality: Right;  right total knee arthroplasty  . TOTAL KNEE ARTHROPLASTY Left 11/04/2003    SocHx  reports that she quit smoking about 25 years ago. Her smoking use included cigarettes. She quit after 20.00 years of use. She has never used smokeless tobacco. She reports that she does not drink alcohol and does not use drugs.  Allergies  Allergen Reactions  . Hydrocodone Itching  . Other Other (  See Comments)  . Oxycodone Itching  . Sulfa Antibiotics Itching    FamHx Family History  Problem Relation Age of Onset  . Arthritis Mother   . Heart attack Mother   . Glaucoma Mother   . Anesthesia problems Mother   . Alcohol abuse Father   . Seizures Father   . Arthritis Brother   . Hypertension Brother     Prior to Admission medications   Medication Sig Start Date End Date Taking? Authorizing Provider  acetaminophen (TYLENOL) 500 MG tablet Take 500 mg by mouth every 6 (six) hours  as needed for pain.   Yes [provider]  atorvastatin (LIPITOR) 10 MG tablet Take 10 mg by mouth daily. 08/20/20  Yes [provider]  celecoxib (CELEBREX) 200 MG capsule Take 200 mg by mouth every morning.    Yes [provider]  cetirizine (ZYRTEC) 10 MG tablet Take 10 mg by mouth every morning.    Yes [provider]  Chlorpheniramine Maleate (CHLOR-TABLETS PO) Take 1 tablet by mouth every morning.   Yes [provider]  ciprofloxacin (CIPRO) 500 MG tablet Take 500 mg by mouth 2 (two) times daily. 10/16/20  Yes [provider]  citalopram (CELEXA) 40 MG tablet Take 40 mg by mouth every morning.    Yes [provider]  cyclobenzaprine (FLEXERIL) 10 MG tablet Take 10 mg by mouth 3 (three) times daily as needed. For muscle spasms   Yes [provider]  lisinopril-hydrochlorothiazide (PRINZIDE,ZESTORETIC) 10-12.5 MG per tablet Take 1 tablet by mouth every morning.    Yes [provider]  metroNIDAZOLE (FLAGYL) 500 MG tablet Take 500 mg by mouth 3 (three) times daily. 10/16/20  Yes [provider]  omeprazole (PRILOSEC) 20 MG capsule Take 20 mg by mouth every morning.    Yes [provider]  traMADol (ULTRAM) 50 MG tablet Take 50 mg by mouth 3 (three) times daily as needed for pain.   Yes [provider]  traMADol-acetaminophen (ULTRACET) 37.5-325 MG tablet Take 1 tablet by mouth every 6 (six) hours as needed. Patient not taking: No sig reported 11/19/16   Jethro Bolus, MD    Physical Exam: Vitals:   10/21/20 0400 10/21/20 0630 10/21/20 0650 10/21/20 0700  BP: (!) 131/109  139/64 (!) 146/67  Pulse: 83 79 76 80  Resp: (!) 22 14 15 17   Temp:      TempSrc:      SpO2: 98% 97% 96% 91%  Weight:      Height:        General: 69 y.o. female resting in bed in NAD Eyes: PERRL, normal sclera ENMT: Nares patent w/o discharge, orophaynx clear, dentition normal, ears w/o  discharge/lesions/ulcers Neck: Supple, trachea midline Cardiovascular: RRR, +S1, S2, no m/g/r, equal pulses throughout Respiratory: CTABL, no w/r/r, normal WOB GI: BS+, NDNT, no masses noted, no organomegaly noted MSK: No e/c/c Skin: No rashes, bruises, ulcerations noted Neuro: A&O x 3, no focal deficits Psyc: Appropriate interaction and affect, calm/cooperative  Labs on Admission: I have personally reviewed following labs and imaging studies  CBC: Recent Labs  Lab 10/20/20 1955  WBC 11.1*  HGB 13.4  HCT 41.1  MCV 85.8  PLT 365   Basic Metabolic Panel: Recent Labs  Lab 10/20/20 1955  NA 136  K 3.5  CL 99  CO2 24  GLUCOSE 129*  BUN 56*  CREATININE 5.76*  CALCIUM 9.2   GFR: Estimated Creatinine Clearance: 9.7 mL/min (A) (by C-G formula based on  SCr of 5.76 mg/dL (H)). Liver Function Tests: Recent Labs  Lab 10/20/20 1955  AST 14*  ALT 9  ALKPHOS 87  BILITOT 0.6  PROT 7.5  ALBUMIN 4.0   Recent Labs  Lab 10/20/20 1955  LIPASE 45   No results for input(s): AMMONIA in the last 168 hours. Coagulation Profile: No results for input(s): INR, PROTIME in the last 168 hours. Cardiac Enzymes: No results for input(s): CKTOTAL, CKMB, CKMBINDEX, TROPONINI in the last 168 hours. BNP (last 3 results) No results for input(s): PROBNP in the last 8760 hours. HbA1C: No results for input(s): HGBA1C in the last 72 hours. CBG: No results for input(s): GLUCAP in the last 168 hours. Lipid Profile: No results for input(s): CHOL, HDL, LDLCALC, TRIG, CHOLHDL, LDLDIRECT in the last 72 hours. Thyroid Function Tests: No results for input(s): TSH, T4TOTAL, FREET4, T3FREE, THYROIDAB in the last 72 hours. Anemia Panel: No results for input(s): VITAMINB12, FOLATE, FERRITIN, TIBC, IRON, RETICCTPCT in the last 72 hours. Urine analysis:    Component Value Date/Time   COLORURINE YELLOW 10/21/2020 0328   APPEARANCEUR CLEAR 10/21/2020 0328   LABSPEC 1.014 10/21/2020 0328   PHURINE 5.0  10/21/2020 0328   GLUCOSEU NEGATIVE 10/21/2020 0328   HGBUR MODERATE (A) 10/21/2020 0328   BILIRUBINUR NEGATIVE 10/21/2020 0328   KETONESUR NEGATIVE 10/21/2020 0328   PROTEINUR NEGATIVE 10/21/2020 0328   UROBILINOGEN 1.0 04/10/2012 1030   NITRITE NEGATIVE 10/21/2020 0328   LEUKOCYTESUR SMALL (A) 10/21/2020 0328    Radiological Exams on Admission: CT Renal Stone Study  Result Date: 10/21/2020 CLINICAL DATA:  Left lower quadrant pain, renal failure EXAM: CT ABDOMEN AND PELVIS WITHOUT CONTRAST TECHNIQUE: Multidetector CT imaging of the abdomen and pelvis was performed following the standard protocol without IV contrast. COMPARISON:  02/07/2015 FINDINGS: Lower chest: Pain large hiatal hernia.  No acute abnormality. 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 Gallbladder unremarkable. Pancreas: No focal abnormality or ductal dilatation. Spleen: No focal abnormality.  Normal size. Adrenals/Urinary Tract: Moderate left hydronephrosis due to 8 mm proximal left ureteral stone. 10 mm midpole stone. Smaller lower pole stone. No stones on the right. 2.2 cm low-density nodule in the left adrenal gland compatible with adenoma. Urinary bladder unremarkable. Stomach/Bowel: Colonic diverticulosis. No active diverticulitis. Normal appendix. Stomach and small bowel decompressed. Vascular/Lymphatic: No evidence of aneurysm or adenopathy. Reproductive: Uterus and adnexa unremarkable.  No mass. Other: No free fluid or free air. Musculoskeletal: No acute bony abnormality. IMPRESSION: 8 mm proximal left ureteral stone with moderate left hydronephrosis. Left nephrolithiasis. Colonic diverticulosis. Large hiatal hernia. Electronically Signed   By: Charlett Nose M.D.   On: 10/21/2020 02:46   Assessment/Plan AKI     - admit to inpatient     - multifactorial: stone, N/V w/ poor po intake and ACEi-HCTZ use     - give fluids     - urology to take to OR today to deal with stone     - hold ACEi-HCTZ     - UA w/o  protein; however, there are leukocytes and rare bacteria; will cover with rocephin since procedure upcoming and possibility of stent     - follow AM labs; if she does not improve after procedure, consider nephro consult  Left Ureteral stone Moderate left hydronephrosis     - see above  HTN     - hold home BP meds     - PRN hydralazine  Anxiety     - resume home meds  HLD     -  resume home statin  DVT prophylaxis: SCDs  Code Status: FULL  Family Communication: None at bedside.  Consults called: EDP called urology   Status is: Inpatient  Remains inpatient appropriate because:Inpatient level of care appropriate due to severity of illness   Dispo: The patient is from: Home              Anticipated d/c is to: Home              Anticipated d/c date is: 2 days              Patient currently is not medically stable to d/c.  Teddy Spikeyrone A Amori Cooperman DO [facility]  If 7PM-7AM, please contact night-coverage [email or web address]  10/21/2020, 7:24 AM

## 2020-10-21 NOTE — Transfer of Care (Signed)
Immediate Anesthesia Transfer of Care Note  Patient: [name]  Procedure(s) Performed: CYSTOSCOPY WITH RETROGRADE PYELOGRAM/URETERAL STENT PLACEMENT (Right )  Patient Location: PACU  Anesthesia Type:General  Level of Consciousness: awake  Airway & Oxygen Therapy: Patient Spontanous Breathing and Patient connected to face mask oxygen  Post-op Assessment: Report given to RN, Post -op Vital signs reviewed and stable and Patient moving all extremities X 4  Post vital signs: Reviewed and stable  Last Vitals:  Vitals Value Taken Time  BP 139/72 10/21/20 1626  Temp    Pulse 72 10/21/20 1627  Resp 17 10/21/20 1627  SpO2 100 % 10/21/20 1627  Vitals shown include unvalidated device data.  Last Pain:  Vitals:   10/21/20 1510  TempSrc: Oral  PainSc:       Patients Stated Pain Goal: 0 (10/21/20 0135)  Complications: No complications documented.

## 2020-10-21 NOTE — ED Notes (Signed)
Attempted to contact pt due to labs and recommend returning to ER.

## 2020-10-21 NOTE — Anesthesia Postprocedure Evaluation (Signed)
Anesthesia Post Note  Patient: [name]  Procedure(s) Performed: CYSTOSCOPY WITH RETROGRADE PYELOGRAM/URETERAL STENT PLACEMENT (Left Ureter)     Patient location during evaluation: PACU Anesthesia Type: General Level of consciousness: awake and alert and oriented Pain management: pain level controlled Vital Signs Assessment: post-procedure vital signs reviewed and stable Respiratory status: spontaneous breathing, nonlabored ventilation and respiratory function stable Cardiovascular status: blood pressure returned to baseline and stable Postop Assessment: no apparent nausea or vomiting Anesthetic complications: no   No complications documented.  Last Vitals:  Vitals:   10/21/20 1700 10/21/20 1734  BP: 112/64 (!) 133/53  Pulse: 80 75  Resp: 15 16  Temp:  36.9 C  SpO2: 94% 94%    Last Pain:  Vitals:   10/21/20 1734  TempSrc: Oral  PainSc:                  [name] A.

## 2020-10-21 NOTE — ED Triage Notes (Signed)
Pt notified to return to ER regarding abnormal lab work. Creatinine was 5.67 and BUN 56.

## 2020-10-21 NOTE — ED Notes (Signed)
Pt was sent from PMD for abnormal labs . Pt has no other complaints and denies pain

## 2020-10-21 NOTE — Progress Notes (Signed)
Have reviewed labs, imaging, history.  Agree left renal decompression with stent is optimal. Posted as add on for afternoon as her K and volume status appear acceptable.   Given multifocality and volume of stones, stentig alone today seems safest with staged managemt with ureteroscopy or SWL at later date after renal function recovery.

## 2020-10-21 NOTE — Consult Note (Signed)
Subjective: CC: Elevated Cr.    Hx: I was asked to see Mrs. Czerniak in consultation by Dr. Judd Lien for an 44mm UPJ stone with obstruction and AKI with a Cr of 5.76 which is up from her baseline of about 0.9 in 2018.  She was actually seen by her PCP earlier this week and her Cr was 6.  She was sent to the ER but didn't stay that time.  She subsequently returned after being called about the labs.  She has had some mild left flank pain, nausea and dark urine for the last 2 days.  She has a history of stones with ESWL x 2 and ureteroscopy with stenting in the past.   She is on chronic Celebrex.  ROS:  Review of Systems  Constitutional: Negative for chills and fever.  Respiratory: Negative for shortness of breath.   Cardiovascular: Negative for chest pain.  All other systems reviewed and are negative.   Allergies  Allergen Reactions  . Hydrocodone Itching  . Other Other (See Comments)  . Oxycodone Itching  . Sulfa Antibiotics Itching    Past Medical History:  Diagnosis Date  . Anxiety   . Bursitis   . Depression   . Family history of adverse reaction to anesthesia    mother-- ponv  . GERD (gastroesophageal reflux disease)   . Hiatal hernia   . History of colon polyps    2003  hyperplastic  . History of kidney stones   . Hypertension   . Migraine   . OA (osteoarthritis)   . Renal disorder   . Sciatica, right side   . SUI (stress urinary incontinence, female)   . Wears glasses     Past Surgical History:  Procedure Laterality Date  . ANKLE ARTHROSCOPY Right 2014  . CARPAL TUNNEL RELEASE Bilateral left 08-03-2007;   right 09-08-2007  . COLONOSCOPY WITH PROPOFOL N/A 04/10/2013   Procedure: COLONOSCOPY WITH PROPOFOL;  Surgeon: Charolett Bumpers, MD;  Location: [facility];  Service: Endoscopy;  Laterality: N/A;  . CYSTO/  RIGHT URETERAL STENT PLACEMENT  05/18/2005  . CYSTOSCOPY N/A 11/19/2016   Procedure: CYSTOSCOPY;  Surgeon: Jethro Bolus, MD;  Location: [facility];  Service: Urology;  Laterality: N/A;  . EXTRACORPOREAL SHOCK WAVE LITHOTRIPSY  x2  . LAPAROSCOPIC APPENDECTOMY  04/03/2007  . NASAL SEPTUM SURGERY  1995  . POSTERIOR REPAIR /  TRANSVAGINAL TAPE Secure PLACEMENT  08/23/2007  . PUBOVAGINAL SLING N/A 11/19/2016   Procedure: EXCISION OF PROTRUDING MESH, PUBO-VAGINAL ALTIS SLING;  Surgeon: Jethro Bolus, MD;  Location: [facility];  Service: Urology;  Laterality: N/A;  . REMOVAL MID PORTION TVT TAPE  09/02/2008  . RIGHT LONG FINGER EXPLORATION/ DEBRIDEMENT FORGEIGN BODY GRANULOMA AND BX  10/20/2001  . SHOULDER ARTHROSCOPY W/ ROTATOR CUFF REPAIR Left 07/26/2005   and Left Total Knee manipulation  . TONSILLECTOMY  child  . TOTAL KNEE ARTHROPLASTY  04/17/2012   Procedure: TOTAL KNEE ARTHROPLASTY;  Surgeon: Nilda Simmer, MD;  Location: [facility];  Service: Orthopedics;  Laterality: Right;  right total knee arthroplasty  . TOTAL KNEE ARTHROPLASTY Left 11/04/2003    Social History   Socioeconomic History  . Marital status: Married    Spouse name: Not on file  . Number of children: Not on file  . Years of education: Not on file  . Highest education level: Not on file  Occupational History  . Not on file  Tobacco Use  . Smoking status: Former Smoker  Years: 20.00    Types: Cigarettes    Quit date: 08/09/1995    Years since quitting: 25.2  . Smokeless tobacco: Never Used  Vaping Use  . Vaping Use: Never used  Substance and Sexual Activity  . Alcohol use: No  . Drug use: No  . Sexual activity: Not on file  Other Topics Concern  . Not on file  Social History Narrative  . Not on file   Social Determinants of Health   Financial Resource Strain: Not on file  Food Insecurity: Not on file  Transportation Needs: Not on file  Physical Activity: Not on file  Stress: Not on file  Social Connections: Not on file  Intimate Partner Violence: Not on file    Family History  Problem Relation Age of Onset  .  Arthritis Mother   . Heart attack Mother   . Glaucoma Mother   . Anesthesia problems Mother   . Alcohol abuse Father   . Seizures Father   . Arthritis Brother   . Hypertension Brother     Anti-infectives: Anti-infectives (From admission, onward)   Start     Dose/Rate Route Frequency Ordered Stop   10/21/20 0630  cefTRIAXone (ROCEPHIN) 1 g in sodium chloride 0.9 % 100 mL IVPB        1 g 200 mL/hr over 30 Minutes Intravenous  Once 10/21/20 0615 10/21/20 0719      No current facility-administered medications for this encounter.   Current Outpatient Medications  Medication Sig Dispense Refill  . acetaminophen (TYLENOL) 500 MG tablet Take 500 mg by mouth every 6 (six) hours as needed for pain.    Marland Kitchen atorvastatin (LIPITOR) 10 MG tablet Take 10 mg by mouth daily.    . celecoxib (CELEBREX) 200 MG capsule Take 200 mg by mouth every morning.     . cetirizine (ZYRTEC) 10 MG tablet Take 10 mg by mouth every morning.     . Chlorpheniramine Maleate (CHLOR-TABLETS PO) Take 1 tablet by mouth every morning.    . ciprofloxacin (CIPRO) 500 MG tablet Take 500 mg by mouth 2 (two) times daily.    . citalopram (CELEXA) 40 MG tablet Take 40 mg by mouth every morning.     . cyclobenzaprine (FLEXERIL) 10 MG tablet Take 10 mg by mouth 3 (three) times daily as needed. For muscle spasms    . lisinopril-hydrochlorothiazide (PRINZIDE,ZESTORETIC) 10-12.5 MG per tablet Take 1 tablet by mouth every morning.     . metroNIDAZOLE (FLAGYL) 500 MG tablet Take 500 mg by mouth 3 (three) times daily.    Marland Kitchen omeprazole (PRILOSEC) 20 MG capsule Take 20 mg by mouth every morning.     . traMADol (ULTRAM) 50 MG tablet Take 50 mg by mouth 3 (three) times daily as needed for pain.    . traMADol-acetaminophen (ULTRACET) 37.5-325 MG tablet Take 1 tablet by mouth every 6 (six) hours as needed. (Patient not taking: No sig reported) 30 tablet 0     Objective: Vital signs in last 24 hours: BP (!) 146/67   Pulse 80   Temp 98.6 F  (37 C) (Oral)   Resp 17   Ht 5\' 2"  (1.575 m)   Wt 91.6 kg   SpO2 91%   BMI 36.95 kg/m   Intake/Output from previous day: No intake/output data recorded. Intake/Output this shift: No intake/output data recorded.   Physical Exam Vitals reviewed.  Constitutional:      Appearance: Normal appearance. She is obese.  HENT:  Head: Normocephalic and atraumatic.  Cardiovascular:     Rate and Rhythm: Normal rate and regular rhythm.  Pulmonary:     Effort: Pulmonary effort is normal. No respiratory distress.     Breath sounds: Normal breath sounds.  Abdominal:     General: Abdomen is flat.     Palpations: Abdomen is soft.     Tenderness: There is abdominal tenderness (left flank).  Musculoskeletal:        General: No swelling or tenderness. Normal range of motion.     Cervical back: Normal range of motion and neck supple.  Skin:    General: Skin is warm and dry.  Neurological:     General: No focal deficit present.     Mental Status: She is alert and oriented to person, place, and time.  Psychiatric:        Mood and Affect: Mood normal.        Behavior: Behavior normal.     Lab Results:  Results for orders placed or performed during the hospital encounter of 10/21/20 (from the past 24 hour(s))  Urinalysis, Routine w reflex microscopic     Status: Abnormal   Collection Time: 10/21/20  3:28 AM  Result Value Ref Range   Color, Urine YELLOW YELLOW   APPearance CLEAR CLEAR   Specific Gravity, Urine 1.014 1.005 - 1.030   pH 5.0 5.0 - 8.0   Glucose, UA NEGATIVE NEGATIVE mg/dL   Hgb urine dipstick MODERATE (A) NEGATIVE   Bilirubin Urine NEGATIVE NEGATIVE   Ketones, ur NEGATIVE NEGATIVE mg/dL   Protein, ur NEGATIVE NEGATIVE mg/dL   Nitrite NEGATIVE NEGATIVE   Leukocytes,Ua SMALL (A) NEGATIVE   RBC / HPF 21-50 0 - 5 RBC/hpf   WBC, UA 21-50 0 - 5 WBC/hpf   Bacteria, UA RARE (A) NONE SEEN   Squamous Epithelial / LPF 0-5 0 - 5   Mucus PRESENT    Hyaline Casts, UA PRESENT    Sodium, urine, random     Status: None   Collection Time: 10/21/20  3:28 AM  Result Value Ref Range   Sodium, Ur 52 mmol/L  Creatinine, urine, random     Status: None   Collection Time: 10/21/20  3:28 AM  Result Value Ref Range   Creatinine, Urine 236.14 mg/dL    BMET Recent Labs    10/20/20 1955  NA 136  K 3.5  CL 99  CO2 24  GLUCOSE 129*  BUN 56*  CREATININE 5.76*  CALCIUM 9.2   PT/INR No results for input(s): LABPROT, INR in the last 72 hours. ABG No results for input(s): PHART, HCO3 in the last 72 hours.  Invalid input(s): PCO2, PO2  Studies/Results: CT Renal Stone Study  Result Date: 10/21/2020 CLINICAL DATA:  Left lower quadrant pain, renal failure EXAM: CT ABDOMEN AND PELVIS WITHOUT CONTRAST TECHNIQUE: Multidetector CT imaging of the abdomen and pelvis was performed following the standard protocol without IV contrast. COMPARISON:  02/07/2015 FINDINGS: Lower chest: Pain large hiatal hernia.  No acute abnormality. 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 Gallbladder unremarkable. Pancreas: No focal abnormality or ductal dilatation. Spleen: No focal abnormality.  Normal size. Adrenals/Urinary Tract: Moderate left hydronephrosis due to 8 mm proximal left ureteral stone. 10 mm midpole stone. Smaller lower pole stone. No stones on the right. 2.2 cm low-density nodule in the left adrenal gland compatible with adenoma. Urinary bladder unremarkable. Stomach/Bowel: Colonic diverticulosis. No active diverticulitis. Normal appendix. Stomach and small bowel decompressed. Vascular/Lymphatic: No evidence of aneurysm or  adenopathy. Reproductive: Uterus and adnexa unremarkable.  No mass. Other: No free fluid or free air. Musculoskeletal: No acute bony abnormality. IMPRESSION: 8 mm proximal left ureteral stone with moderate left hydronephrosis. Left nephrolithiasis. Colonic diverticulosis. Large hiatal hernia. Electronically Signed   By: Charlett NoseKevin  Dover M.D.   On: 10/21/2020 02:46      Assessment/Plan: 8mm LUPJ stone with LLP stone, obstruction and AKI.  I will get her set up for cystoscopy and left ureteral stenting later today and have reviewed the risks of the procedure in detail.           No follow-ups on file.    CC: Dr. Theodoro Griston Delo     Bjorn PippinJohn Sukari Grist 10/21/2020 [phone]

## 2020-10-21 NOTE — Anesthesia Preprocedure Evaluation (Addendum)
Anesthesia Evaluation  Patient identified by MRN, date of birth, ID band Patient awake    Reviewed: Allergy & Precautions, NPO status , Patient's Chart, lab work & pertinent test results, reviewed documented beta blocker date and time   History of Anesthesia Complications (+) Family history of anesthesia reaction  Airway Mallampati: II  TM Distance: >3 FB Neck ROM: Full    Dental no notable dental hx. (+) Teeth Intact   Pulmonary former smoker,    Pulmonary exam normal breath sounds clear to auscultation       Cardiovascular hypertension, Pt. on medications Normal cardiovascular exam Rhythm:Regular Rate:Normal     Neuro/Psych  Headaches, PSYCHIATRIC DISORDERS Anxiety Depression  Neuromuscular disease    GI/Hepatic Neg liver ROS, hiatal hernia, GERD  Controlled and Medicated,  Endo/Other  Obesity  Renal/GU ARFRenal diseaseLeft proximal ureteral calculus with left hydronephrosis  negative genitourinary   Musculoskeletal  (+) Arthritis , Osteoarthritis,    Abdominal (+) + obese,   Peds  Hematology negative hematology ROS (+)   Anesthesia Other Findings   Reproductive/Obstetrics                            Anesthesia Physical Anesthesia Plan  ASA: III  Anesthesia Plan: General   Post-op Pain Management:    Induction: Intravenous  PONV Risk Score and Plan: 4 or greater and Ondansetron and Treatment may vary due to age or medical condition  Airway Management Planned: LMA  Additional Equipment:   Intra-op Plan:   Post-operative Plan: Extubation in OR  Informed Consent: I have reviewed the patients History and Physical, chart, labs and discussed the procedure including the risks, benefits and alternatives for the proposed anesthesia with the patient or authorized representative who has indicated his/her understanding and acceptance.     Dental advisory given  Plan Discussed with:  CRNA and Anesthesiologist  Anesthesia Plan Comments:         Anesthesia Quick Evaluation

## 2020-10-21 NOTE — ED Provider Notes (Addendum)
[facility]-EMERGENCY DEPT Provider Note   CSN: [id number] Arrival date & time: 10/21/20  0128     History Chief Complaint  Patient presents with  . Abnormal Lab         [name] is a 69 y.o. female.  Patient is a 69 year old female with past medical history of hypertension, GERD.  She presents today for evaluation of abnormal laboratory study.  Patient had a routine appointment with her doctor today.  She had lab work obtained showing a creatinine of 6.  Patient denies to me she is experiencing any symptoms.  She denies any decreased urine output, dysuria, or fevers.  She denies any chest pain or difficulty breathing.  The history is provided by the patient.       Past Medical History:  Diagnosis Date  . Anxiety   . Bursitis   . Depression   . Family history of adverse reaction to anesthesia    mother-- ponv  . GERD (gastroesophageal reflux disease)   . Hiatal hernia   . History of colon polyps    2003  hyperplastic  . History of kidney stones   . Hypertension   . Migraine   . OA (osteoarthritis)   . Renal disorder   . Sciatica, right side   . SUI (stress urinary incontinence, female)   . Wears glasses     Patient Active Problem List   Diagnosis Date Noted  . Arthritis   . Right knee DJD   . Left knee DJD   . Depression   . Hypertension   . Anxiety   . Pinched nerve     Past Surgical History:  Procedure Laterality Date  . ANKLE ARTHROSCOPY Right 2014  . CARPAL TUNNEL RELEASE Bilateral left 08-03-2007;   right 09-08-2007  . COLONOSCOPY WITH PROPOFOL N/A 04/10/2013   Procedure: COLONOSCOPY WITH PROPOFOL;  Surgeon: Charolett Bumpers, MD;  Location: [facility];  Service: Endoscopy;  Laterality: N/A;  . CYSTO/  RIGHT URETERAL STENT PLACEMENT  05/18/2005  . CYSTOSCOPY N/A 11/19/2016   Procedure: CYSTOSCOPY;  Surgeon: Jethro Bolus, MD;  Location: [facility];  Service: Urology;  Laterality: N/A;  .  EXTRACORPOREAL SHOCK WAVE LITHOTRIPSY  x2  . LAPAROSCOPIC APPENDECTOMY  04/03/2007  . NASAL SEPTUM SURGERY  1995  . POSTERIOR REPAIR /  TRANSVAGINAL TAPE Secure PLACEMENT  08/23/2007  . PUBOVAGINAL SLING N/A 11/19/2016   Procedure: EXCISION OF PROTRUDING MESH, PUBO-VAGINAL ALTIS SLING;  Surgeon: Jethro Bolus, MD;  Location: [facility];  Service: Urology;  Laterality: N/A;  . REMOVAL MID PORTION TVT TAPE  09/02/2008  . RIGHT LONG FINGER EXPLORATION/ DEBRIDEMENT FORGEIGN BODY GRANULOMA AND BX  10/20/2001  . SHOULDER ARTHROSCOPY W/ ROTATOR CUFF REPAIR Left 07/26/2005   and Left Total Knee manipulation  . TONSILLECTOMY  child  . TOTAL KNEE ARTHROPLASTY  04/17/2012   Procedure: TOTAL KNEE ARTHROPLASTY;  Surgeon: Nilda Simmer, MD;  Location: [facility];  Service: Orthopedics;  Laterality: Right;  right total knee arthroplasty  . TOTAL KNEE ARTHROPLASTY Left 11/04/2003     OB History   No obstetric history on file.     Family History  Problem Relation Age of Onset  . Arthritis Mother   . Heart attack Mother   . Glaucoma Mother   . Anesthesia problems Mother   . Alcohol abuse Father   . Seizures Father   . Arthritis Brother   . Hypertension Brother     Social History  Tobacco Use  . Smoking status: Former Smoker    Years: 20.00    Types: Cigarettes    Quit date: 08/09/1995    Years since quitting: 25.2  . Smokeless tobacco: Never Used  Vaping Use  . Vaping Use: Never used  Substance Use Topics  . Alcohol use: No  . Drug use: No    Home Medications Prior to Admission medications   Medication Sig Start Date End Date Taking? Authorizing Provider  acetaminophen (TYLENOL) 500 MG tablet Take 500 mg by mouth every 6 (six) hours as needed for pain.    [provider]  celecoxib (CELEBREX) 200 MG capsule Take 200 mg by mouth every morning.     [provider]  cetirizine (ZYRTEC) 10 MG tablet Take 10 mg by mouth every morning.     [provider]  Chlorpheniramine Maleate (CHLOR-TABLETS PO) Take 1 tablet by mouth every morning.    [provider]  Cholecalciferol (VITAMIN D3) 5000 units CAPS Take 1 capsule by mouth daily.    [provider]  citalopram (CELEXA) 40 MG tablet Take 40 mg by mouth every morning.     [provider]  cyclobenzaprine (FLEXERIL) 10 MG tablet Take 10 mg by mouth 3 (three) times daily as needed. For muscle spasms    [provider]  lisinopril-hydrochlorothiazide (PRINZIDE,ZESTORETIC) 10-12.5 MG per tablet Take 1 tablet by mouth every morning.     [provider]  omeprazole (PRILOSEC) 20 MG capsule Take 20 mg by mouth every morning.     [provider]  traMADol (ULTRAM) 50 MG tablet Take 50 mg by mouth 3 (three) times daily as needed for pain.    [provider]  traMADol-acetaminophen (ULTRACET) 37.5-325 MG tablet Take 1 tablet by mouth every 6 (six) hours as needed. 11/19/16   Jethro Bolus, MD  vitamin E 400 UNIT capsule Take 400 Units by mouth daily.    [provider]    Allergies    Hydrocodone, Other, Oxycodone, and Sulfa antibiotics  Review of Systems   Review of Systems  All other systems reviewed and are negative.   Physical Exam Updated Vital Signs BP (!) 133/56   Pulse 84   Temp 98.6 F (37 C) (Oral)   Resp 17   Ht 5\' 2"  (1.575 m)   Wt 91.6 kg   SpO2 95%   BMI 36.95 kg/m   Physical Exam Vitals and nursing note reviewed.  Constitutional:      General: She is not in acute distress.    Appearance: She is well-developed and well-nourished. She is not diaphoretic.  HENT:     Head: Normocephalic and atraumatic.  Cardiovascular:     Rate and Rhythm: Normal rate and regular rhythm.     Heart sounds: No murmur heard. No friction rub. No gallop.   Pulmonary:     Effort: Pulmonary effort is normal. No respiratory distress.     Breath sounds: Normal breath sounds. No wheezing.  Abdominal:      General: Bowel sounds are normal. There is no distension.     Palpations: Abdomen is soft.     Tenderness: There is no abdominal tenderness.  Musculoskeletal:        General: Normal range of motion.     Cervical back: Normal range of motion and neck supple.  Skin:    General: Skin is warm and dry.  Neurological:     Mental Status: She is alert and oriented to person,  place, and time.     ED Results / Procedures / Treatments   Labs (all labs ordered are listed, but only abnormal results are displayed) Labs Reviewed - No data to display  EKG None  Radiology No results found.  Procedures Procedures (including critical care time)  Medications Ordered in ED Medications  sodium chloride 0.9 % bolus 1,000 mL (has no administration in time range)    ED Course  I have reviewed the triage vital signs and the nursing notes.  Pertinent labs & imaging results that were available during my care of the patient were reviewed by me and considered in my medical decision making (see chart for details).    MDM Rules/Calculators/A&P  Patient sent from the doctor's office today for evaluation of creatinine of 6.  Patient had routine laboratory studies obtained during this appointment.  She has no symptoms otherwise.  She does report possibly less urine output, but no dysuria.  Laboratory studies repeated here showing creatinine of 5.8.  Renal CT obtained shows an 8 mm proximal left ureteral stone with moderate left hydronephrosis.  Patient to be admitted to the hospitalist service for further treatment.  I have discussed the renal calculus with Dr. Annabell Howells from urology.  They will consult this morning.  Final Clinical Impression(s) / ED Diagnoses Final diagnoses:  None    Rx / DC Orders ED Discharge Orders    None       Geoffery Lyons, MD 10/21/20 2951    Geoffery Lyons, MD 10/21/20 [phone]

## 2020-10-22 ENCOUNTER — Encounter (HOSPITAL_COMMUNITY): Payer: Self-pay | Admitting: Urology

## 2020-10-22 DIAGNOSIS — N1330 Unspecified hydronephrosis: Secondary | ICD-10-CM

## 2020-10-22 DIAGNOSIS — I10 Essential (primary) hypertension: Secondary | ICD-10-CM

## 2020-10-22 DIAGNOSIS — N201 Calculus of ureter: Secondary | ICD-10-CM

## 2020-10-22 DIAGNOSIS — N179 Acute kidney failure, unspecified: Secondary | ICD-10-CM

## 2020-10-22 LAB — COMPREHENSIVE METABOLIC PANEL
ALT: 9 U/L (ref 0–44)
AST: 13 U/L — ABNORMAL LOW (ref 15–41)
Albumin: 3.1 g/dL — ABNORMAL LOW (ref 3.5–5.0)
Alkaline Phosphatase: 73 U/L (ref 38–126)
Anion gap: 14 (ref 5–15)
BUN: 49 mg/dL — ABNORMAL HIGH (ref 8–23)
CO2: 23 mmol/L (ref 22–32)
Calcium: 8.8 mg/dL — ABNORMAL LOW (ref 8.9–10.3)
Chloride: 103 mmol/L (ref 98–111)
Creatinine, Ser: 2.06 mg/dL — ABNORMAL HIGH (ref 0.44–1.00)
GFR, Estimated: 26 mL/min — ABNORMAL LOW (ref >60)
Glucose, Bld: 155 mg/dL — ABNORMAL HIGH (ref 70–99)
Potassium: 3.6 mmol/L (ref 3.5–5.1)
Sodium: 140 mmol/L (ref 135–145)
Total Bilirubin: 0.6 mg/dL (ref 0.3–1.2)
Total Protein: 6.7 g/dL (ref 6.5–8.1)

## 2020-10-22 LAB — CBC
HCT: 37.6 % (ref 36.0–46.0)
Hemoglobin: 12.1 g/dL (ref 12.0–15.0)
MCH: 27.8 pg (ref 26.0–34.0)
MCHC: 32.2 g/dL (ref 30.0–36.0)
MCV: 86.4 fL (ref 80.0–100.0)
Platelets: 334 10*3/uL (ref 150–400)
RBC: 4.35 MIL/uL (ref 3.87–5.11)
RDW: 14 % (ref 11.5–15.5)
WBC: 7.6 10*3/uL (ref 4.0–10.5)
nRBC: 0 % (ref 0.0–0.2)

## 2020-10-22 MED ORDER — CITALOPRAM HYDROBROMIDE 20 MG PO TABS
20.0000 mg | ORAL_TABLET | Freq: Every morning | ORAL | Status: DC
  Administered 2020-10-23: 10:00:00 20 mg via ORAL
  Filled 2020-10-22: qty 1

## 2020-10-22 NOTE — Progress Notes (Signed)
1 Day Post-Op   Subjective/Chief Complaint:  1 - Acute Renal Failure - Cr 5's up from baseline <1. On chronic NSAIDS and left ureteral stone  2 - Left Ureteral Stone - left 27mm UPJ sotne + renal stones by Er CT 10/2020. S/p left stent for decompression 12/14.  Today "[name]" is improving. Cr now 2's with good UOP. No fevers.   Objective: Vital signs in last 24 hours: Temp:  [98.2 F (36.8 C)-98.8 F (37.1 C)] 98.2 F (36.8 C) (12/15 0516) Pulse Rate:  [70-80] 79 (12/15 0516) Resp:  [14-18] 16 (12/15 0516) BP: (112-145)/(53-72) 117/54 (12/15 0516) SpO2:  [91 %-100 %] 95 % (12/15 0516) Weight:  [94 kg] 94 kg (12/14 1449) Last BM Date:  (pt states about a week ago)  Intake/Output from previous day: 12/14 0701 - 12/15 0700 In: 1300 [I.V.:1300] Out: 700 [Urine:700] Intake/Output this shift: No intake/output data recorded.  General appearance: alert and cooperative Eyes: negative Nose: Nares normal. Septum midline. Mucosa normal. No drainage or sinus tenderness. Throat: lips, mucosa, and tongue normal; teeth and gums normal Neck: supple, symmetrical, trachea midline Back: symmetric, no curvature. ROM normal. No CVA tenderness. Resp: Non-labored.  Cardio: Nl rate GI: soft, non-tender; bowel sounds normal; no masses,  no organomegaly and stable truncal obesity. No significant CVAT.  Extremities: extremities normal, atraumatic, no cyanosis or edema Lymph nodes: Cervical, supraclavicular, and axillary nodes normal. Neurologic: Grossly normal  Lab Results:  Recent Labs    10/20/20 1955 10/22/20 0639  WBC 11.1* 7.6  HGB 13.4 12.1  HCT 41.1 37.6  PLT 365 334   BMET Recent Labs    10/20/20 1955 10/22/20 0639  NA 136 140  K 3.5 3.6  CL 99 103  CO2 24 23  GLUCOSE 129* 155*  BUN 56* 49*  CREATININE 5.76* 2.06*  CALCIUM 9.2 8.8*   PT/INR No results for input(s): LABPROT, INR in the last 72 hours. ABG No results for input(s): PHART, HCO3 in the last 72  hours.  Invalid input(s): PCO2, PO2  Studies/Results: DG C-Arm 1-60 Min-No Report  Result Date: 10/21/2020 Fluoroscopy was utilized by the requesting physician.  No radiographic interpretation.   CT Renal Stone Study  Result Date: 10/21/2020 CLINICAL DATA:  Left lower quadrant pain, renal failure EXAM: CT ABDOMEN AND PELVIS WITHOUT CONTRAST TECHNIQUE: Multidetector CT imaging of the abdomen and pelvis was performed following the standard protocol without IV contrast. COMPARISON:  02/07/2015 FINDINGS: Lower chest: Pain large hiatal hernia.  No acute abnormality. 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 Gallbladder unremarkable. Pancreas: No focal abnormality or ductal dilatation. Spleen: No focal abnormality.  Normal size. Adrenals/Urinary Tract: Moderate left hydronephrosis due to 8 mm proximal left ureteral stone. 10 mm midpole stone. Smaller lower pole stone. No stones on the right. 2.2 cm low-density nodule in the left adrenal gland compatible with adenoma. Urinary bladder unremarkable. Stomach/Bowel: Colonic diverticulosis. No active diverticulitis. Normal appendix. Stomach and small bowel decompressed. Vascular/Lymphatic: No evidence of aneurysm or adenopathy. Reproductive: Uterus and adnexa unremarkable.  No mass. Other: No free fluid or free air. Musculoskeletal: No acute bony abnormality. IMPRESSION: 8 mm proximal left ureteral stone with moderate left hydronephrosis. Left nephrolithiasis. Colonic diverticulosis. Large hiatal hernia. Electronically Signed   By: Charlett Nose M.D.   On: 10/21/2020 02:46    Anti-infectives: Anti-infectives (From admission, onward)   Start     Dose/Rate Route Frequency Ordered Stop   10/22/20 0600  cefTRIAXone (ROCEPHIN) 1 g in sodium chloride 0.9 % 100  mL IVPB        1 g 200 mL/hr over 30 Minutes Intravenous Every 24 hours 10/21/20 0857     10/21/20 0630  cefTRIAXone (ROCEPHIN) 1 g in sodium chloride 0.9 % 100 mL IVPB        1 g 200 mL/hr  over 30 Minutes Intravenous  Once 10/21/20 0615 10/21/20 0719      Assessment/Plan:  Improving quickly by lab markers. This is encouraging. DC foley today. OK for DC from Urol perspective likely tomorrow as long as GFR trending better. We will arrange for next stage treatment of kidney stone in elective setting in few weeks. Likely hold NSAIDS for now, pt and husband understand importance of this.    [name] 10/22/2020

## 2020-10-22 NOTE — Op Note (Signed)
NAMEDARIEN, MIGNOGNA [facility] MEDICAL RECORD PY:[id number] ACCOUNT [id number] DATE OF BIRTH:07/03/1951 FACILITY: [facility] LOCATION: [facility] PHYSICIAN:Annamae Shivley Berneice Heinrich, MD  OPERATIVE REPORT  DATE OF PROCEDURE:  10/21/2020  PREOPERATIVE DIAGNOSIS:  Left ureteral stone with acute renal failure.  PROCEDURE: 1.  Cystoscopy, bilateral retrograde pyelograms, interpretation. 2.  Left ureteral stent placement 5 x 24 Polaris, no tether.  ESTIMATED BLOOD LOSS:  Nil.  MEDICATIONS:  None.  SPECIMENS:  None.  FINDINGS: 1.  Mild left hydronephrosis and filling defect and proximal ureter with known stone. 2.  Successful placement of left ureteral stent, proximal end in renal pelvis, distal end in urinary bladder.  INDICATIONS:  The patient is a pleasant 69 year old lady with history of chronic nonsteroidal pain medicine use and urolithiasis.  She is found on workup of acute renal failure to have a left proximal ureteral stone and significant volume renal stone  with a creatinine of greater than 5.  Fortunately, her volume status was acceptable, potassium also.  However, it was felt that her unilateral renal infection was certainly contributing to her multifactorial renal dysfunction.  Options were discussed for  management including recommended path of urgent left ureteral stenting followed by NSAIDs, hydration to promote renal function and recovery and then definitive stone management, elective setting and she wished to proceed.  Informed consent was obtained  and placed in medical record.  DESCRIPTION OF PROCEDURE:  The patient being herself, procedure being cystoscopy with left stent placement was confirmed.  Procedure timeout was performed.  Intravenous antibiotics administered.  General anesthesia induced.  The patient was placed into a  low lithotomy position.  Sterile field was created, prepped and draped the vagina, introitus, and proximal thighs using iodine.  Cystourethroscopy was then performed using a  21-French rigid cystoscope with offset lens.  Inspection of anterior and  posterior urethra was unremarkable.  Inspection of bladder revealed no diverticula, calcifications or papillary lesions.  The left ureteral orifice was cannulated with 6-French renal catheter and left retrograde pyelogram was obtained.  Left retrograde pyelogram demonstrated a single left ureter, single system left kidney.  There was a filling defect in proximal ureter consistent with known stone.  There was mild hydronephrosis above this and a large filling defect in the renal pelvis  also consistent with additional stone material.  A 0.03 ZIPwire was advanced to lower pole, over which a 5 x 24 Polaris-type stent was placed using cystoscopic and fluoroscopic guidance.  Good proximal and distal plane were noted.  Given her acute renal  failure and need for close urine out monitoring, a 14-French Foley catheter was placed free to straight drain, 10 mL around the balloon.  The procedure was terminated.  The patient tolerated the procedure well.  No immediate complications.  The patient  was taken to postanesthesia care in stable condition.  Plan for continued medical admission to verify and recovery of renal function.  IN/NUANCE  D:10/21/2020 T:10/22/2020 JOB:[id number]

## 2020-10-22 NOTE — Progress Notes (Signed)
Patient ID: [name], female   DOB: 09-26-1951, 69 y.o.   MRN: [id number]  PROGRESS NOTE    [name]  INO:[id number] DOB: [birth year] DOA: 10/21/2020 PCP: Kirby Funk, MD   Brief Narrative:  69 year old female with history of hypertension, depression and renal stones presented at the instruction of her PCP due to elevated creatinine. She was found to have creatinine of 5.76. CT renal stone study showed left nephrolithiasis with 8 mm proximal left ureteral stone and moderate left hydronephrosis. Urology was consulted and patient underwent cystoscopy and left ureteral stent placement on 10/21/2020.  Assessment & Plan:   Acute renal failure secondary to obstructive uropathy Left ureteral stone with moderate left-sided hydronephrosis -Presented with creatinine of 5.76 and CT renal stone study as above. -Urology was consulted and patient underwent cystoscopy and left ureteral stent placement on 10/21/2020 -Creatinine has much improved to 2.06 today. Continue IV fluids. Currently on empiric Rocephin has been. Follow urology recommendations. -DC celecoxib  Leukocytosis -Resolved  Hypertension -Blood pressure currently stable. Continue hydralazine as needed  Anxiety -Decrease citalopram to 20 mg daily as per pharmacy recommendations  Hyperlipidemia -Continue statin  Obesity -Outpatient follow-up    DVT prophylaxis: SCDs Code Status:  Full Family Communication: None at bedside Disposition Plan: Status is: Inpatient  Remains inpatient appropriate because:Inpatient level of care appropriate due to severity of illness   Dispo: The patient is from: Home              Anticipated d/c is to: Home              Anticipated d/c date is: 1 day              Patient currently is not medically stable to d/c.   Consultants: Urology  Procedures: Cystoscopy and left ureteral stent placement on 10/21/2020  Antimicrobials: Rocephin   Subjective: Patient seen and examined at  bedside. She denies worsening flank pain. No overnight fever, vomiting or worsening shortness of breath reported.  Objective: Vitals:   10/21/20 1700 10/21/20 1734 10/21/20 2020 10/22/20 0516  BP: 112/64 (!) 133/53 (!) 132/55 (!) 117/54  Pulse: 80 75 70 79  Resp: 15 16 14 16   Temp:  98.4 F (36.9 C) 98.4 F (36.9 C) 98.2 F (36.8 C)  TempSrc:  Oral Oral Oral  SpO2: 94% 94% 91% 95%  Weight:      Height:        Intake/Output Summary (Last 24 hours) at 10/22/2020 1105 Last data filed at 10/22/2020 10/24/2020 Gross per 24 hour  Intake 1300 ml  Output 700 ml  Net 600 ml   Filed Weights   10/21/20 0135 10/21/20 0825 10/21/20 1449  Weight: 91.6 kg 94 kg 94 kg    Examination:  General exam: Appears calm and comfortable  Respiratory system: Bilateral decreased breath sounds at bases Cardiovascular system: S1 & S2 heard, Rate controlled Gastrointestinal system: Abdomen is obese, nondistended, soft and nontender. Normal bowel sounds heard. Extremities: No cyanosis, clubbing, edema  Central nervous system: Alert and oriented. No focal neurological deficits. Moving extremities Skin: No rashes, lesions or ulcers Psychiatry: Judgement and insight appear normal. Mood & affect appropriate.     Data Reviewed: I have personally reviewed following labs and imaging studies  CBC: Recent Labs  Lab 10/20/20 1955 10/22/20 0639  WBC 11.1* 7.6  HGB 13.4 12.1  HCT 41.1 37.6  MCV 85.8 86.4  PLT 365 334   Basic Metabolic Panel: Recent Labs  Lab 10/20/20 1955  10/22/20 0639  NA 136 140  K 3.5 3.6  CL 99 103  CO2 24 23  GLUCOSE 129* 155*  BUN 56* 49*  CREATININE 5.76* 2.06*  CALCIUM 9.2 8.8*   GFR: Estimated Creatinine Clearance: 27.5 mL/min (A) (by C-G formula based on SCr of 2.06 mg/dL (H)). Liver Function Tests: Recent Labs  Lab 10/20/20 1955 10/22/20 0639  AST 14* 13*  ALT 9 9  ALKPHOS 87 73  BILITOT 0.6 0.6  PROT 7.5 6.7  ALBUMIN 4.0 3.1*   Recent Labs  Lab  10/20/20 1955  LIPASE 45   No results for input(s): AMMONIA in the last 168 hours. Coagulation Profile: No results for input(s): INR, PROTIME in the last 168 hours. Cardiac Enzymes: No results for input(s): CKTOTAL, CKMB, CKMBINDEX, TROPONINI in the last 168 hours. BNP (last 3 results) No results for input(s): PROBNP in the last 8760 hours. HbA1C: No results for input(s): HGBA1C in the last 72 hours. CBG: No results for input(s): GLUCAP in the last 168 hours. Lipid Profile: No results for input(s): CHOL, HDL, LDLCALC, TRIG, CHOLHDL, LDLDIRECT in the last 72 hours. Thyroid Function Tests: No results for input(s): TSH, T4TOTAL, FREET4, T3FREE, THYROIDAB in the last 72 hours. Anemia Panel: No results for input(s): VITAMINB12, FOLATE, FERRITIN, TIBC, IRON, RETICCTPCT in the last 72 hours. Sepsis Labs: No results for input(s): PROCALCITON, LATICACIDVEN in the last 168 hours.  Recent Results (from the past 240 hour(s))  Resp Panel by RT-PCR (Flu A&B, Covid) Nasopharyngeal Swab     Status: None   Collection Time: 10/21/20  7:10 AM   Specimen: Nasopharyngeal Swab; Nasopharyngeal(NP) swabs in vial transport medium  Result Value Ref Range Status   SARS Coronavirus 2 by RT PCR NEGATIVE NEGATIVE Final    Comment: (NOTE) SARS-CoV-2 target nucleic acids are NOT DETECTED.  The SARS-CoV-2 RNA is generally detectable in upper respiratory specimens during the acute phase of infection. The lowest concentration of SARS-CoV-2 viral copies this assay can detect is 138 copies/mL. A negative result does not preclude SARS-Cov-2 infection and should not be used as the sole basis for treatment or other patient management decisions. A negative result may occur with  improper specimen collection/handling, submission of specimen other than nasopharyngeal swab, presence of viral mutation(s) within the areas targeted by this assay, and inadequate number of viral copies(<138 copies/mL). A negative result  must be combined with clinical observations, patient history, and epidemiological information. The expected result is Negative.  Fact Sheet for Patients:  BloggerCourse.com  Fact Sheet for Healthcare Providers:  SeriousBroker.it  This test is no t yet approved or cleared by the [place] FDA and  has been authorized for detection and/or diagnosis of SARS-CoV-2 by FDA under an Emergency Use Authorization (EUA). This EUA will remain  in effect (meaning this test can be used) for the duration of the COVID-19 declaration under Section 564(b)(1) of the Act, 21 U.S.C.section 360bbb-3(b)(1), unless the authorization is terminated  or revoked sooner.       Influenza A by PCR NEGATIVE NEGATIVE Final   Influenza B by PCR NEGATIVE NEGATIVE Final    Comment: (NOTE) The Xpert Xpress SARS-CoV-2/FLU/RSV plus assay is intended as an aid in the diagnosis of influenza from Nasopharyngeal swab specimens and should not be used as a sole basis for treatment. Nasal washings and aspirates are unacceptable for Xpert Xpress SARS-CoV-2/FLU/RSV testing.  Fact Sheet for Patients: BloggerCourse.com  Fact Sheet for Healthcare Providers: SeriousBroker.it  This test is not yet approved or cleared  by the [place] and has been authorized for detection and/or diagnosis of SARS-CoV-2 by FDA under an Emergency Use Authorization (EUA). This EUA will remain in effect (meaning this test can be used) for the duration of the COVID-19 declaration under Section 564(b)(1) of the Act, 21 U.S.C. section 360bbb-3(b)(1), unless the authorization is terminated or revoked.  Performed at [facility], [address], [place], [place] [address]          Radiology Studies: DG C-Arm 1-60 Min-No Report  Result Date: 10/21/2020 Fluoroscopy was utilized by the requesting physician.  No  radiographic interpretation.   CT Renal Stone Study  Result Date: 10/21/2020 CLINICAL DATA:  Left lower quadrant pain, renal failure EXAM: CT ABDOMEN AND PELVIS WITHOUT CONTRAST TECHNIQUE: Multidetector CT imaging of the abdomen and pelvis was performed following the standard protocol without IV contrast. COMPARISON:  02/07/2015 FINDINGS: Lower chest: Pain large hiatal hernia.  No acute abnormality. 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 Gallbladder unremarkable. Pancreas: No focal abnormality or ductal dilatation. Spleen: No focal abnormality.  Normal size. Adrenals/Urinary Tract: Moderate left hydronephrosis due to 8 mm proximal left ureteral stone. 10 mm midpole stone. Smaller lower pole stone. No stones on the right. 2.2 cm low-density nodule in the left adrenal gland compatible with adenoma. Urinary bladder unremarkable. Stomach/Bowel: Colonic diverticulosis. No active diverticulitis. Normal appendix. Stomach and small bowel decompressed. Vascular/Lymphatic: No evidence of aneurysm or adenopathy. Reproductive: Uterus and adnexa unremarkable.  No mass. Other: No free fluid or free air. Musculoskeletal: No acute bony abnormality. IMPRESSION: 8 mm proximal left ureteral stone with moderate left hydronephrosis. Left nephrolithiasis. Colonic diverticulosis. Large hiatal hernia. Electronically Signed   By: Charlett Nose M.D.   On: 10/21/2020 02:46        Scheduled Meds: . atorvastatin  10 mg Oral Daily  . celecoxib  200 mg Oral q morning - 10a  . Chlorhexidine Gluconate Cloth  6 each Topical Daily  . citalopram  40 mg Oral q morning - 10a   Continuous Infusions: . sodium chloride 75 mL/hr at 10/22/20 0635  . cefTRIAXone (ROCEPHIN)  IV Stopped (10/22/20 0550)          Glade Lloyd, MD [facility] 10/22/2020, 11:05 AM

## 2020-10-23 LAB — CBC WITH DIFFERENTIAL/PLATELET
Abs Immature Granulocytes: 0.02 10*3/uL (ref 0.00–0.07)
Basophils Absolute: 0 10*3/uL (ref 0.0–0.1)
Basophils Relative: 0 %
Eosinophils Absolute: 0.1 10*3/uL (ref 0.0–0.5)
Eosinophils Relative: 2 %
HCT: 36.8 % (ref 36.0–46.0)
Hemoglobin: 11.5 g/dL — ABNORMAL LOW (ref 12.0–15.0)
Immature Granulocytes: 0 %
Lymphocytes Relative: 37 %
Lymphs Abs: 2.6 10*3/uL (ref 0.7–4.0)
MCH: 27.3 pg (ref 26.0–34.0)
MCHC: 31.3 g/dL (ref 30.0–36.0)
MCV: 87.4 fL (ref 80.0–100.0)
Monocytes Absolute: 0.6 10*3/uL (ref 0.1–1.0)
Monocytes Relative: 8 %
Neutro Abs: 3.6 10*3/uL (ref 1.7–7.7)
Neutrophils Relative %: 53 %
Platelets: 354 10*3/uL (ref 150–400)
RBC: 4.21 MIL/uL (ref 3.87–5.11)
RDW: 14.4 % (ref 11.5–15.5)
WBC: 6.9 10*3/uL (ref 4.0–10.5)
nRBC: 0 % (ref 0.0–0.2)

## 2020-10-23 LAB — BASIC METABOLIC PANEL
Anion gap: 9 (ref 5–15)
BUN: 40 mg/dL — ABNORMAL HIGH (ref 8–23)
CO2: 27 mmol/L (ref 22–32)
Calcium: 8.8 mg/dL — ABNORMAL LOW (ref 8.9–10.3)
Chloride: 107 mmol/L (ref 98–111)
Creatinine, Ser: 1.53 mg/dL — ABNORMAL HIGH (ref 0.44–1.00)
GFR, Estimated: 37 mL/min — ABNORMAL LOW (ref >60)
Glucose, Bld: 95 mg/dL (ref 70–99)
Potassium: 3.4 mmol/L — ABNORMAL LOW (ref 3.5–5.1)
Sodium: 143 mmol/L (ref 135–145)

## 2020-10-23 LAB — MAGNESIUM: Magnesium: 1.5 mg/dL — ABNORMAL LOW (ref 1.7–2.4)

## 2020-10-23 MED ORDER — CITALOPRAM HYDROBROMIDE 40 MG PO TABS: 20.0000 mg | ORAL_TABLET | Freq: Every morning | ORAL | Status: AC

## 2020-10-23 MED ORDER — CETIRIZINE HCL 10 MG PO TABS: 10.0000 mg | ORAL_TABLET | ORAL | Status: AC | PRN

## 2020-10-23 MED ORDER — MAGNESIUM OXIDE 400 (241.3 MG) MG PO TABS
800.0000 mg | ORAL_TABLET | Freq: Once | ORAL | Status: AC
  Administered 2020-10-23: 10:00:00 800 mg via ORAL
  Filled 2020-10-23: qty 2

## 2020-10-23 MED ORDER — MAGNESIUM SULFATE 2 GM/50ML IV SOLN: 2.0000 g | Freq: Once | INTRAVENOUS | Status: DC

## 2020-10-23 MED ORDER — POTASSIUM CHLORIDE CRYS ER 20 MEQ PO TBCR
40.0000 meq | EXTENDED_RELEASE_TABLET | Freq: Once | ORAL | Status: AC
  Administered 2020-10-23: 10:00:00 40 meq via ORAL
  Filled 2020-10-23: qty 2

## 2020-10-23 NOTE — Discharge Summary (Signed)
Physician Discharge Summary  [name] MAU:[id number] DOB: Dec 14, 1950 DOA: 10/21/2020  PCP: Bethany Funk, MD  Admit date: 10/21/2020 Discharge date: 10/23/2020  Admitted From: Home Disposition: Home  Recommendations for Outpatient Follow-up:  1. Follow up with PCP in 1 week with repeat CBC/BMP 2. Outpatient follow-up with urology 3. Follow up in ED if symptoms worsen or new appear   Home Health: No Equipment/Devices: None  Discharge Condition: Stable CODE STATUS: Full Diet recommendation: Heart healthy  Brief/Interim Summary: 69 year old female with history of hypertension, depression and renal stones presented at the instruction of her PCP due to elevated creatinine. She was found to have creatinine of 5.76. CT renal stone study showed left nephrolithiasis with 8 mm proximal left ureteral stone and moderate left hydronephrosis. Urology was consulted and patient underwent cystoscopy and left ureteral stent placement on 10/21/2020.  Subsequently, her renal function has improved.  She is tolerating diet.  She is hemodynamically stable and afebrile.  She will be discharged home today with outpatient follow-up with urology.  Discharge Diagnoses:   Acute renal failure secondary to obstructive uropathy Left ureteral stone with moderate left-sided hydronephrosis -Presented with creatinine of 5.76 and CT renal stone study as above. -Urology was consulted and patient underwent cystoscopy and left ureteral stent placement on 10/21/2020 -Creatinine has much improved to 1.53 today.  Off IV fluids.  Treated empirically with Rocephin as well.  Urology has cleared the patient for discharge with outpatient follow-up with urology and patient does not need any more antibiotics -DC'd celecoxib; follow-up with PCP regarding the same  Leukocytosis -Resolved  Hypokalemia and hypomagnesemia -Replace prior to discharge  Hypertension -Blood pressure currently stable.  Antihypertensives will  remain on hold till reevaluation by PCP  Anxiety -Decrease citalopram to 20 mg daily as per pharmacy recommendations  Hyperlipidemia -Continue statin  Obesity -Outpatient follow-up  Discharge Instructions  Discharge Instructions    Diet - low sodium heart healthy   Complete by: As directed    Increase activity slowly   Complete by: As directed      Allergies as of 10/23/2020      Reactions   Hydrocodone Itching   Other Other (See Comments)   Oxycodone Itching   Sulfa Antibiotics Itching      Medication List    STOP taking these medications   celecoxib 200 MG capsule Commonly known as: CELEBREX   CHLOR-TABLETS PO   ciprofloxacin 500 MG tablet Commonly known as: CIPRO   lisinopril-hydrochlorothiazide 10-12.5 MG tablet Commonly known as: ZESTORETIC   metroNIDAZOLE 500 MG tablet Commonly known as: FLAGYL   traMADol-acetaminophen 37.5-325 MG tablet Commonly known as: Ultracet     TAKE these medications   acetaminophen 500 MG tablet Commonly known as: TYLENOL Take 500 mg by mouth every 6 (six) hours as needed for pain.   atorvastatin 10 MG tablet Commonly known as: LIPITOR Take 10 mg by mouth daily.   cetirizine 10 MG tablet Commonly known as: ZYRTEC Take 1 tablet (10 mg total) by mouth as needed for allergies. What changed:   when to take this  reasons to take this   citalopram 40 MG tablet Commonly known as: CELEXA Take 0.5 tablets (20 mg total) by mouth every morning. What changed: how much to take   cyclobenzaprine 10 MG tablet Commonly known as: FLEXERIL Take 10 mg by mouth 3 (three) times daily as needed. For muscle spasms   omeprazole 20 MG capsule Commonly known as: PRILOSEC Take 20 mg by mouth every morning.  traMADol 50 MG tablet Commonly known as: ULTRAM Take 50 mg by mouth 3 (three) times daily as needed for pain.       Follow-up Information    Bethany Funk, MD. Schedule an appointment as soon as possible for a visit in  1 week(s).   Specialty: Internal Medicine Why: With repeat CBC/BMP Contact information: [address] [place] [address] [phone]        Bethany Ache, MD. Schedule an appointment as soon as possible for a visit in 1 week(s).   Specialty: Urology Contact information: [address] [place] [address] [phone]              Allergies  Allergen Reactions  . Hydrocodone Itching  . Other Other (See Comments)  . Oxycodone Itching  . Sulfa Antibiotics Itching    Consultations:  Urology   Procedures/Studies: DG C-Arm 1-60 Min-No Report  Result Date: 10/21/2020 Fluoroscopy was utilized by the requesting physician.  No radiographic interpretation.   CT Renal Stone Study  Result Date: 10/21/2020 CLINICAL DATA:  Left lower quadrant pain, renal failure EXAM: CT ABDOMEN AND PELVIS WITHOUT CONTRAST TECHNIQUE: Multidetector CT imaging of the abdomen and pelvis was performed following the standard protocol without IV contrast. COMPARISON:  02/07/2015 FINDINGS: Lower chest: Pain large hiatal hernia.  No acute abnormality. 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 Gallbladder unremarkable. Pancreas: No focal abnormality or ductal dilatation. Spleen: No focal abnormality.  Normal size. Adrenals/Urinary Tract: Moderate left hydronephrosis due to 8 mm proximal left ureteral stone. 10 mm midpole stone. Smaller lower pole stone. No stones on the right. 2.2 cm low-density nodule in the left adrenal gland compatible with adenoma. Urinary bladder unremarkable. Stomach/Bowel: Colonic diverticulosis. No active diverticulitis. Normal appendix. Stomach and small bowel decompressed. Vascular/Lymphatic: No evidence of aneurysm or adenopathy. Reproductive: Uterus and adnexa unremarkable.  No mass. Other: No free fluid or free air. Musculoskeletal: No acute bony abnormality. IMPRESSION: 8 mm proximal left ureteral stone with moderate left hydronephrosis. Left  nephrolithiasis. Colonic diverticulosis. Large hiatal hernia. Electronically Signed   By: Charlett Nose M.D.   On: 10/21/2020 02:46       Subjective: Patient seen and examined at bedside.  She feels much better and thinks that she is ready to go home today.  Denies worsening abdominal pain, nausea or vomiting.  Discharge Exam: Vitals:   10/22/20 2037 10/23/20 0525  BP: 125/63 (!) 117/53  Pulse: 85 73  Resp: 18 14  Temp: 98.2 F (36.8 C) 98.2 F (36.8 C)  SpO2: 98% (!) 89%    General: Pt is alert, awake, not in acute distress Cardiovascular: rate controlled, S1/S2 + Respiratory: bilateral decreased breath sounds at bases Abdominal: Soft, NT, ND, bowel sounds + Extremities: no edema, no cyanosis    The results of significant diagnostics from this hospitalization (including imaging, microbiology, ancillary and laboratory) are listed below for reference.     Microbiology: Recent Results (from the past 240 hour(s))  Resp Panel by RT-PCR (Flu A&B, Covid) Nasopharyngeal Swab     Status: None   Collection Time: 10/21/20  7:10 AM   Specimen: Nasopharyngeal Swab; Nasopharyngeal(NP) swabs in vial transport medium  Result Value Ref Range Status   SARS Coronavirus 2 by RT PCR NEGATIVE NEGATIVE Final    Comment: (NOTE) SARS-CoV-2 target nucleic acids are NOT DETECTED.  The SARS-CoV-2 RNA is generally detectable in upper respiratory specimens during the acute phase of infection. The lowest concentration of SARS-CoV-2 viral copies this  assay can detect is 138 copies/mL. A negative result does not preclude SARS-Cov-2 infection and should not be used as the sole basis for treatment or other patient management decisions. A negative result may occur with  improper specimen collection/handling, submission of specimen other than nasopharyngeal swab, presence of viral mutation(s) within the areas targeted by this assay, and inadequate number of viral copies(<138 copies/mL). A negative  result must be combined with clinical observations, patient history, and epidemiological information. The expected result is Negative.  Fact Sheet for Patients:  BloggerCourse.com  Fact Sheet for Healthcare Providers:  SeriousBroker.it  This test is no t yet approved or cleared by the [place] FDA and  has been authorized for detection and/or diagnosis of SARS-CoV-2 by FDA under an Emergency Use Authorization (EUA). This EUA will remain  in effect (meaning this test can be used) for the duration of the COVID-19 declaration under Section 564(b)(1) of the Act, 21 U.S.C.section 360bbb-3(b)(1), unless the authorization is terminated  or revoked sooner.       Influenza A by PCR NEGATIVE NEGATIVE Final   Influenza B by PCR NEGATIVE NEGATIVE Final    Comment: (NOTE) The Xpert Xpress SARS-CoV-2/FLU/RSV plus assay is intended as an aid in the diagnosis of influenza from Nasopharyngeal swab specimens and should not be used as a sole basis for treatment. Nasal washings and aspirates are unacceptable for Xpert Xpress SARS-CoV-2/FLU/RSV testing.  Fact Sheet for Patients: BloggerCourse.com  Fact Sheet for Healthcare Providers: SeriousBroker.it  This test is not yet approved or cleared by the [place] FDA and has been authorized for detection and/or diagnosis of SARS-CoV-2 by FDA under an Emergency Use Authorization (EUA). This EUA will remain in effect (meaning this test can be used) for the duration of the COVID-19 declaration under Section 564(b)(1) of the Act, 21 U.S.C. section 360bbb-3(b)(1), unless the authorization is terminated or revoked.  Performed at [facility], [address], [place], [place] [address]      Labs: BNP (last 3 results) No results for input(s): BNP in the last 8760 hours. Basic Metabolic Panel: Recent Labs  Lab 10/20/20 1955  10/22/20 0639 10/23/20 0540  NA 136 140 143  K 3.5 3.6 3.4*  CL 99 103 107  CO2 24 23 27   GLUCOSE 129* 155* 95  BUN 56* 49* 40*  CREATININE 5.76* 2.06* 1.53*  CALCIUM 9.2 8.8* 8.8*  MG  --   --  1.5*   Liver Function Tests: Recent Labs  Lab 10/20/20 1955 10/22/20 0639  AST 14* 13*  ALT 9 9  ALKPHOS 87 73  BILITOT 0.6 0.6  PROT 7.5 6.7  ALBUMIN 4.0 3.1*   Recent Labs  Lab 10/20/20 1955  LIPASE 45   No results for input(s): AMMONIA in the last 168 hours. CBC: Recent Labs  Lab 10/20/20 1955 10/22/20 0639 10/23/20 0540  WBC 11.1* 7.6 6.9  NEUTROABS  --   --  3.6  HGB 13.4 12.1 11.5*  HCT 41.1 37.6 36.8  MCV 85.8 86.4 87.4  PLT 365 334 354   Cardiac Enzymes: No results for input(s): CKTOTAL, CKMB, CKMBINDEX, TROPONINI in the last 168 hours. BNP: Invalid input(s): POCBNP CBG: No results for input(s): GLUCAP in the last 168 hours. D-Dimer No results for input(s): DDIMER in the last 72 hours. Hgb A1c No results for input(s): HGBA1C in the last 72 hours. Lipid Profile No results for input(s): CHOL, HDL, LDLCALC, TRIG, CHOLHDL, LDLDIRECT in the last 72 hours. Thyroid function studies No  results for input(s): TSH, T4TOTAL, T3FREE, THYROIDAB in the last 72 hours.  Invalid input(s): FREET3 Anemia work up No results for input(s): VITAMINB12, FOLATE, FERRITIN, TIBC, IRON, RETICCTPCT in the last 72 hours. Urinalysis    Component Value Date/Time   COLORURINE YELLOW 10/21/2020 0328   APPEARANCEUR CLEAR 10/21/2020 0328   LABSPEC 1.014 10/21/2020 0328   PHURINE 5.0 10/21/2020 0328   GLUCOSEU NEGATIVE 10/21/2020 0328   HGBUR MODERATE (A) 10/21/2020 0328   BILIRUBINUR NEGATIVE 10/21/2020 0328   KETONESUR NEGATIVE 10/21/2020 0328   PROTEINUR NEGATIVE 10/21/2020 0328   UROBILINOGEN 1.0 04/10/2012 1030   NITRITE NEGATIVE 10/21/2020 0328   LEUKOCYTESUR SMALL (A) 10/21/2020 0328   Sepsis Labs Invalid input(s): PROCALCITONIN,  WBC,  LACTICIDVEN Microbiology Recent  Results (from the past 240 hour(s))  Resp Panel by RT-PCR (Flu A&B, Covid) Nasopharyngeal Swab     Status: None   Collection Time: 10/21/20  7:10 AM   Specimen: Nasopharyngeal Swab; Nasopharyngeal(NP) swabs in vial transport medium  Result Value Ref Range Status   SARS Coronavirus 2 by RT PCR NEGATIVE NEGATIVE Final    Comment: (NOTE) SARS-CoV-2 target nucleic acids are NOT DETECTED.  The SARS-CoV-2 RNA is generally detectable in upper respiratory specimens during the acute phase of infection. The lowest concentration of SARS-CoV-2 viral copies this assay can detect is 138 copies/mL. A negative result does not preclude SARS-Cov-2 infection and should not be used as the sole basis for treatment or other patient management decisions. A negative result may occur with  improper specimen collection/handling, submission of specimen other than nasopharyngeal swab, presence of viral mutation(s) within the areas targeted by this assay, and inadequate number of viral copies(<138 copies/mL). A negative result must be combined with clinical observations, patient history, and epidemiological information. The expected result is Negative.  Fact Sheet for Patients:  BloggerCourse.comhttps://www.fda.gov/media/152166/download  Fact Sheet for Healthcare Providers:  SeriousBroker.ithttps://www.fda.gov/media/152162/download  This test is no t yet approved or cleared by the [place] FDA and  has been authorized for detection and/or diagnosis of SARS-CoV-2 by FDA under an Emergency Use Authorization (EUA). This EUA will remain  in effect (meaning this test can be used) for the duration of the COVID-19 declaration under Section 564(b)(1) of the Act, 21 U.S.C.section 360bbb-3(b)(1), unless the authorization is terminated  or revoked sooner.       Influenza A by PCR NEGATIVE NEGATIVE Final   Influenza B by PCR NEGATIVE NEGATIVE Final    Comment: (NOTE) The Xpert Xpress SARS-CoV-2/FLU/RSV plus assay is intended as an aid in the  diagnosis of influenza from Nasopharyngeal swab specimens and should not be used as a sole basis for treatment. Nasal washings and aspirates are unacceptable for Xpert Xpress SARS-CoV-2/FLU/RSV testing.  Fact Sheet for Patients: BloggerCourse.comhttps://www.fda.gov/media/152166/download  Fact Sheet for Healthcare Providers: SeriousBroker.ithttps://www.fda.gov/media/152162/download  This test is not yet approved or cleared by the [place] FDA and has been authorized for detection and/or diagnosis of SARS-CoV-2 by FDA under an Emergency Use Authorization (EUA). This EUA will remain in effect (meaning this test can be used) for the duration of the COVID-19 declaration under Section 564(b)(1) of the Act, 21 U.S.C. section 360bbb-3(b)(1), unless the authorization is terminated or revoked.  Performed at [facility], [address] [place], [place] [id number]      Time coordinating discharge: 35 minutes  SIGNED:   Glade LloydKshitiz Marua Qin, MD  [facility] 10/23/2020, 10:55 AM

## 2020-10-23 NOTE — Progress Notes (Signed)
Pt discharged home in stable condition. Discharge instructions given. Pt verbalized understanding. No immediate questions or concerns at this time. Pt discharged from unit via wheelchair.  

## 2020-10-27 ENCOUNTER — Other Ambulatory Visit: Payer: Self-pay | Admitting: Urology

## 2020-11-03 ENCOUNTER — Other Ambulatory Visit (HOSPITAL_COMMUNITY)
Admission: RE | Admit: 2020-11-03 | Discharge: 2020-11-03 | Disposition: A | Discharge status: Home / Self Care | Payer: Medicare Other | Source: Ambulatory Visit | Attending: Urology | Admitting: Urology

## 2020-11-03 ENCOUNTER — Other Ambulatory Visit: Payer: Self-pay

## 2020-11-03 ENCOUNTER — Encounter (HOSPITAL_BASED_OUTPATIENT_CLINIC_OR_DEPARTMENT_OTHER): Payer: Self-pay | Admitting: Urology

## 2020-11-03 DIAGNOSIS — Z20822 Contact with and (suspected) exposure to covid-19: Secondary | ICD-10-CM | POA: Diagnosis not present

## 2020-11-03 DIAGNOSIS — Z01812 Encounter for preprocedural laboratory examination: Secondary | ICD-10-CM | POA: Diagnosis present

## 2020-11-03 LAB — SARS CORONAVIRUS 2 (TAT 6-24 HRS): SARS Coronavirus 2: NEGATIVE

## 2020-11-03 NOTE — Progress Notes (Signed)
Spoke w/ via phone for pre-op interview--- PT Lab needs dos----  Istat and EKG             Lab results------ no COVID test ------ done 11-03-2020 result in epic Arrive at ------- 0945 NPO after MN NO Solid Food.  Clear liquids from MN until--- 0845 Medications to take morning of surgery ----- Prilosec, Celexa Diabetic medication ----- n/a Patient Special Instructions ----- n/a Pre-Op special Istructions ----- n/a Patient verbalized understanding of instructions that were given at this phone interview. Patient denies shortness of breath, chest pain, fever, cough at this phone interview.

## 2020-11-04 NOTE — Anesthesia Preprocedure Evaluation (Addendum)
Anesthesia Evaluation  Patient identified by MRN, date of birth, ID band Patient awake    Reviewed: Allergy & Precautions, H&P , NPO status , Patient's Chart, lab work & pertinent test results  History of Anesthesia Complications Negative for: history of anesthetic complications  Airway Mallampati: II  TM Distance: >3 FB Neck ROM: Full    Dental no notable dental hx.    Pulmonary neg pulmonary ROS, former smoker,    Pulmonary exam normal breath sounds clear to auscultation       Cardiovascular hypertension, Normal cardiovascular exam Rhythm:Regular Rate:Normal     Neuro/Psych  Headaches, Anxiety Depression negative neurological ROS     GI/Hepatic Neg liver ROS, hiatal hernia, GERD  Medicated and Controlled,  Endo/Other  negative endocrine ROSBMI 39  Renal/GU ARFRenal disease (renal stones)  negative genitourinary   Musculoskeletal negative musculoskeletal ROS (+) Arthritis ,   Abdominal   Peds negative pediatric ROS (+)  Hematology negative hematology ROS (+)   Anesthesia Other Findings Day of surgery medications reviewed with patient.  Reproductive/Obstetrics negative OB ROS                            Anesthesia Physical Anesthesia Plan  ASA: II  Anesthesia Plan: General   Post-op Pain Management:    Induction: Intravenous  PONV Risk Score and Plan: 3 and Treatment may vary due to age or medical condition, Ondansetron and Dexamethasone  Airway Management Planned: LMA  Additional Equipment: None  Intra-op Plan:   Post-operative Plan: Extubation in OR  Informed Consent: I have reviewed the patients History and Physical, chart, labs and discussed the procedure including the risks, benefits and alternatives for the proposed anesthesia with the patient or authorized representative who has indicated his/her understanding and acceptance.     Dental advisory given  Plan  Discussed with: CRNA and Surgeon  Anesthesia Plan Comments:       Anesthesia Quick Evaluation

## 2020-11-05 ENCOUNTER — Ambulatory Visit (HOSPITAL_BASED_OUTPATIENT_CLINIC_OR_DEPARTMENT_OTHER)
Admission: RE | Admit: 2020-11-05 | Discharge: 2020-11-05 | Disposition: A | Discharge status: Home / Self Care | Payer: Medicare Other | Attending: Urology | Admitting: Urology

## 2020-11-05 ENCOUNTER — Encounter (HOSPITAL_BASED_OUTPATIENT_CLINIC_OR_DEPARTMENT_OTHER)
Admission: RE | Disposition: A | Discharge status: Home / Self Care | Payer: Self-pay | Source: Home / Self Care | Attending: Urology

## 2020-11-05 ENCOUNTER — Ambulatory Visit (HOSPITAL_BASED_OUTPATIENT_CLINIC_OR_DEPARTMENT_OTHER): Payer: Medicare Other | Admitting: Anesthesiology

## 2020-11-05 ENCOUNTER — Other Ambulatory Visit: Payer: Self-pay

## 2020-11-05 ENCOUNTER — Encounter (HOSPITAL_BASED_OUTPATIENT_CLINIC_OR_DEPARTMENT_OTHER): Payer: Self-pay | Admitting: Urology

## 2020-11-05 DIAGNOSIS — N202 Calculus of kidney with calculus of ureter: Secondary | ICD-10-CM | POA: Insufficient documentation

## 2020-11-05 DIAGNOSIS — I10 Essential (primary) hypertension: Secondary | ICD-10-CM | POA: Insufficient documentation

## 2020-11-05 DIAGNOSIS — K219 Gastro-esophageal reflux disease without esophagitis: Secondary | ICD-10-CM | POA: Diagnosis not present

## 2020-11-05 DIAGNOSIS — Z885 Allergy status to narcotic agent status: Secondary | ICD-10-CM | POA: Diagnosis not present

## 2020-11-05 DIAGNOSIS — Z820 Family history of epilepsy and other diseases of the nervous system: Secondary | ICD-10-CM | POA: Diagnosis not present

## 2020-11-05 DIAGNOSIS — Z791 Long term (current) use of non-steroidal anti-inflammatories (NSAID): Secondary | ICD-10-CM | POA: Insufficient documentation

## 2020-11-05 DIAGNOSIS — Z882 Allergy status to sulfonamides status: Secondary | ICD-10-CM | POA: Insufficient documentation

## 2020-11-05 DIAGNOSIS — Z87891 Personal history of nicotine dependence: Secondary | ICD-10-CM | POA: Diagnosis not present

## 2020-11-05 DIAGNOSIS — Z8249 Family history of ischemic heart disease and other diseases of the circulatory system: Secondary | ICD-10-CM | POA: Diagnosis not present

## 2020-11-05 DIAGNOSIS — Z811 Family history of alcohol abuse and dependence: Secondary | ICD-10-CM | POA: Insufficient documentation

## 2020-11-05 DIAGNOSIS — Z87442 Personal history of urinary calculi: Secondary | ICD-10-CM | POA: Insufficient documentation

## 2020-11-05 DIAGNOSIS — Z8261 Family history of arthritis: Secondary | ICD-10-CM | POA: Diagnosis not present

## 2020-11-05 HISTORY — PX: HOLMIUM LASER APPLICATION: SHX5852

## 2020-11-05 HISTORY — DX: Calculus of kidney: N20.0

## 2020-11-05 HISTORY — DX: Calculus of ureter: N20.1

## 2020-11-05 HISTORY — PX: CYSTOSCOPY WITH RETROGRADE PYELOGRAM, URETEROSCOPY AND STENT PLACEMENT: SHX5789

## 2020-11-05 LAB — POCT I-STAT, CHEM 8
BUN: 14 mg/dL (ref 8–23)
Calcium, Ion: 1.26 mmol/L (ref 1.15–1.40)
Chloride: 103 mmol/L (ref 98–111)
Creatinine, Ser: 0.9 mg/dL (ref 0.44–1.00)
Glucose, Bld: 105 mg/dL — ABNORMAL HIGH (ref 70–99)
HCT: 38 % (ref 36.0–46.0)
Hemoglobin: 12.9 g/dL (ref 12.0–15.0)
Potassium: 3.5 mmol/L (ref 3.5–5.1)
Sodium: 142 mmol/L (ref 135–145)
TCO2: 25 mmol/L (ref 22–32)

## 2020-11-05 SURGERY — CYSTOURETEROSCOPY, WITH RETROGRADE PYELOGRAM AND STENT INSERTION
Anesthesia: General | Site: Renal | Laterality: Left

## 2020-11-05 MED ORDER — TRAMADOL HCL 50 MG PO TABS: 50.0000 mg | ORAL_TABLET | Freq: Four times a day (QID) | ORAL | 0 refills | Status: AC | PRN

## 2020-11-05 MED ORDER — OXYCODONE HCL 5 MG PO TABS: 5.0000 mg | ORAL_TABLET | Freq: Once | ORAL | Status: DC | PRN

## 2020-11-05 MED ORDER — DEXAMETHASONE SODIUM PHOSPHATE 10 MG/ML IJ SOLN
INTRAMUSCULAR | Status: DC | PRN
  Administered 2020-11-05: 10 mg via INTRAVENOUS

## 2020-11-05 MED ORDER — SODIUM CHLORIDE 0.9 % IV SOLN
1.0000 g | INTRAVENOUS | Status: AC
  Administered 2020-11-05: 12:00:00 1 g via INTRAVENOUS

## 2020-11-05 MED ORDER — KETOROLAC TROMETHAMINE 30 MG/ML IJ SOLN
INTRAMUSCULAR | Status: DC | PRN
  Administered 2020-11-05: 30 mg via INTRAVENOUS

## 2020-11-05 MED ORDER — FENTANYL CITRATE (PF) 100 MCG/2ML IJ SOLN: 25.0000 ug | INTRAMUSCULAR | Status: DC | PRN

## 2020-11-05 MED ORDER — FENTANYL CITRATE (PF) 100 MCG/2ML IJ SOLN
INTRAMUSCULAR | Status: AC
  Filled 2020-11-05: qty 2

## 2020-11-05 MED ORDER — SODIUM CHLORIDE 0.9 % IR SOLN
Status: DC | PRN
  Administered 2020-11-05: 3000 mL

## 2020-11-05 MED ORDER — ONDANSETRON HCL 4 MG/2ML IJ SOLN
INTRAMUSCULAR | Status: DC | PRN
  Administered 2020-11-05: 4 mg via INTRAVENOUS

## 2020-11-05 MED ORDER — ACETAMINOPHEN 500 MG PO TABS
1000.0000 mg | ORAL_TABLET | Freq: Once | ORAL | Status: AC
  Administered 2020-11-05: 11:00:00 1000 mg via ORAL

## 2020-11-05 MED ORDER — CEFTRIAXONE SODIUM 1 G IJ SOLR
INTRAMUSCULAR | Status: AC
  Filled 2020-11-05: qty 10

## 2020-11-05 MED ORDER — KETOROLAC TROMETHAMINE 30 MG/ML IJ SOLN
INTRAMUSCULAR | Status: AC
  Filled 2020-11-05: qty 1

## 2020-11-05 MED ORDER — ONDANSETRON HCL 4 MG/2ML IJ SOLN
INTRAMUSCULAR | Status: AC
  Filled 2020-11-05: qty 2

## 2020-11-05 MED ORDER — PROPOFOL 10 MG/ML IV BOLUS
INTRAVENOUS | Status: DC | PRN
  Administered 2020-11-05: 170 mg via INTRAVENOUS

## 2020-11-05 MED ORDER — LACTATED RINGERS IV SOLN: INTRAVENOUS | Status: DC

## 2020-11-05 MED ORDER — PROMETHAZINE HCL 25 MG/ML IJ SOLN: 6.2500 mg | INTRAMUSCULAR | Status: DC | PRN

## 2020-11-05 MED ORDER — IOHEXOL 300 MG/ML  SOLN
INTRAMUSCULAR | Status: DC | PRN
  Administered 2020-11-05: 12:00:00 10 mL

## 2020-11-05 MED ORDER — LIDOCAINE 2% (20 MG/ML) 5 ML SYRINGE
INTRAMUSCULAR | Status: DC | PRN
  Administered 2020-11-05: 100 mg via INTRAVENOUS

## 2020-11-05 MED ORDER — FENTANYL CITRATE (PF) 100 MCG/2ML IJ SOLN
INTRAMUSCULAR | Status: DC | PRN
  Administered 2020-11-05: 25 ug via INTRAVENOUS
  Administered 2020-11-05 (×2): 50 ug via INTRAVENOUS
  Administered 2020-11-05: 25 ug via INTRAVENOUS
  Administered 2020-11-05: 50 ug via INTRAVENOUS

## 2020-11-05 MED ORDER — ACETAMINOPHEN 500 MG PO TABS
ORAL_TABLET | ORAL | Status: AC
  Filled 2020-11-05: qty 2

## 2020-11-05 MED ORDER — KETOROLAC TROMETHAMINE 10 MG PO TABS: 10.0000 mg | ORAL_TABLET | Freq: Three times a day (TID) | ORAL | 0 refills | Status: AC | PRN

## 2020-11-05 MED ORDER — SODIUM CHLORIDE 0.9 % IV SOLN
INTRAVENOUS | Status: AC
  Filled 2020-11-05: qty 100

## 2020-11-05 MED ORDER — DEXAMETHASONE SODIUM PHOSPHATE 10 MG/ML IJ SOLN
INTRAMUSCULAR | Status: AC
  Filled 2020-11-05: qty 1

## 2020-11-05 MED ORDER — OXYCODONE HCL 5 MG/5ML PO SOLN: 5.0000 mg | Freq: Once | ORAL | Status: DC | PRN

## 2020-11-05 MED ORDER — PROPOFOL 10 MG/ML IV BOLUS
INTRAVENOUS | Status: AC
  Filled 2020-11-05: qty 20

## 2020-11-05 MED ORDER — CEPHALEXIN 500 MG PO CAPS: 500.0000 mg | ORAL_CAPSULE | Freq: Two times a day (BID) | ORAL | 0 refills | Status: AC

## 2020-11-05 SURGICAL SUPPLY — 29 items
BAG DRAIN URO-CYSTO SKYTR STRL (DRAIN) ×3 IMPLANT
BAG DRN UROCATH (DRAIN) ×1
BASKET LASER NITINOL 1.9FR (BASKET) ×2 IMPLANT
BSKT STON RTRVL 120 1.9FR (BASKET) ×1
CATH INTERMIT  6FR 70CM (CATHETERS) ×2 IMPLANT
CLOTH BEACON ORANGE TIMEOUT ST (SAFETY) ×3 IMPLANT
FIBER LASER FLEXIVA 365 (UROLOGICAL SUPPLIES) IMPLANT
GLOVE BIO SURGEON STRL SZ7.5 (GLOVE) ×3 IMPLANT
GLOVE BIOGEL M 6.5 STRL (GLOVE) ×4 IMPLANT
GOWN STRL REUS W/ TWL LRG LVL3 (GOWN DISPOSABLE) IMPLANT
GOWN STRL REUS W/TWL LRG LVL3 (GOWN DISPOSABLE) ×6 IMPLANT
GUIDEWIRE ANG ZIPWIRE 038X150 (WIRE) ×3 IMPLANT
GUIDEWIRE STR DUAL SENSOR (WIRE) ×3 IMPLANT
IV NS 1000ML (IV SOLUTION)
IV NS 1000ML BAXH (IV SOLUTION) ×1 IMPLANT
IV NS IRRIG 3000ML ARTHROMATIC (IV SOLUTION) ×3 IMPLANT
KIT TURNOVER CYSTO (KITS) ×3 IMPLANT
MANIFOLD NEPTUNE II (INSTRUMENTS) ×3 IMPLANT
NS IRRIG 500ML POUR BTL (IV SOLUTION) ×3 IMPLANT
PACK CYSTO (CUSTOM PROCEDURE TRAY) ×3 IMPLANT
SHEATH URETERAL 12FRX28CM (UROLOGICAL SUPPLIES) ×2 IMPLANT
STENT POLARIS 5FRX24 (STENTS) ×2 IMPLANT
SYR 10ML LL (SYRINGE) ×3 IMPLANT
TRACTIP FLEXIVA PULS ID 200XHI (Laser) IMPLANT
TRACTIP FLEXIVA PULSE ID 200 (Laser) ×3
TUBE CONNECTING 12'X1/4 (SUCTIONS) ×1
TUBE CONNECTING 12X1/4 (SUCTIONS) ×2 IMPLANT
TUBE FEEDING 8FR 16IN STR KANG (MISCELLANEOUS) IMPLANT
TUBING UROLOGY SET (TUBING) ×3 IMPLANT

## 2020-11-05 NOTE — OR Nursing (Signed)
Left ureteral stent removed in [facility] OR #2 by Dr. Berneice Heinrich.

## 2020-11-05 NOTE — Discharge Instructions (Signed)
1 - You may have urinary urgency (bladder spasms) and bloody urine on / off with stent in place. This is normal.  2 - Call MD or go to ER for fever >102, severe pain / nausea / vomiting not relieved by medications, or acute change in medical status  3 - Stent will remain in place until your follow-up appointment.    Post Anesthesia Home Care Instructions  Activity: Get plenty of rest for the remainder of the day. A responsible individual must stay with you for 24 hours following the procedure.  For the next 24 hours, DO NOT: -Drive a car -Advertising copywriter -Drink alcoholic beverages -Take any medication unless instructed by your physician -Make any legal decisions or sign important papers.  Meals: Start with liquid foods such as gelatin or soup. Progress to regular foods as tolerated. Avoid greasy, spicy, heavy foods. If nausea and/or vomiting occur, drink only clear liquids until the nausea and/or vomiting subsides. Call your physician if vomiting continues.  Special Instructions/Symptoms: Your throat may feel dry or sore from the anesthesia or the breathing tube placed in your throat during surgery. If this causes discomfort, gargle with warm salt water. The discomfort should disappear within 24 hours.    CYSTOSCOPY HOME CARE INSTRUCTIONS  Activity: Rest for the remainder of the day.  Do not drive or operate equipment today.  You may resume normal activities in one to two days as instructed by your physician.   Meals: Drink plenty of liquids and eat light foods such as gelatin or soup this evening.  You may return to a normal meal plan tomorrow.  Return to Work: You may return to work in one to two days or as instructed by your physician.  Special Instructions / Symptoms: Call your physician if any of these symptoms occur:   -persistent or heavy bleeding  -bleeding which continues after first few urination  -large blood clots that are difficult to pass  -urine stream  diminishes or stops completely  -fever equal to or higher than 101 degrees Farenheit.  -cloudy urine with a strong, foul odor  -severe pain  Females should always wipe from front to back after elimination.  You may feel some burning pain when you urinate.  This should disappear with time.  Applying moist heat to the lower abdomen or a hot tub bath may help relieve the pain. \

## 2020-11-05 NOTE — Brief Op Note (Signed)
11/05/2020  12:53 PM  PATIENT:  [name]  69 y.o. female  PRE-OPERATIVE DIAGNOSIS:  LEFT URETERAL AND RENAL STONES  POST-OPERATIVE DIAGNOSIS:  LEFT URETERAL AND RENAL STONES  PROCEDURE:  Procedure(s): CYSTOSCOPY WITH RETROGRADE PYELOGRAM, URETEROSCOPY AND STENT REPLACEMENT (Left) HOLMIUM LASER APPLICATION (Left)  SURGEON:  Surgeon(s) and Role:    [name], MD - Primary  PHYSICIAN ASSISTANT:   ASSISTANTS: none   ANESTHESIA:   general  EBL:  0 mL   BLOOD ADMINISTERED:none  DRAINS: none   LOCAL MEDICATIONS USED:  NONE  SPECIMEN:  Source of Specimen:  Left renal / ureteral stone fragments  DISPOSITION OF SPECIMEN:  [facility] for compositional analysis  COUNTS:  YES  TOURNIQUET:  * No tourniquets in log *  DICTATION: .Other Dictation: Dictation Number [id number]  PLAN OF CARE: Discharge to home after PACU  PATIENT DISPOSITION:  PACU - hemodynamically stable.   Delay start of Pharmacological VTE agent (>24hrs) due to surgical blood loss or risk of bleeding: not applicable

## 2020-11-05 NOTE — H&P (Signed)
[name] is an 69 y.o. female.    Chief Complaint: Pre-Op LEFT Ureteroscopic Stone Manipulation  HPI:   1 - Acute Renal Failure - Cr 5's up from baseline <1 10/2020 from left ureteral stone. On chronic NSAIDS and left ureteral stone. Stented and prompt improvement in GFR with most recetn Cr now 1.5.   2 - Left Ureteral Stone - left 87mm UPJ sotne + renal stones by Er CT 10/2020. S/p left stent for decompression 12/14.  Today "[name]" is seen to proceed with LEFT ureteroscoping stone manipulation. No interval fevers. C19 screen negative. Most recent UA without significant infectious paremeters.    Past Medical History:  Diagnosis Date  . Anxiety   . Bursitis   . Depression   . Family history of adverse reaction to anesthesia    mother-- ponv  . GERD (gastroesophageal reflux disease)   . Hiatal hernia   . History of colon polyps   . History of kidney stones   . Hypertension   . Left renal stone   . Left ureteral stone   . Migraine   . OA (osteoarthritis)   . Sciatica, right side   . SUI (stress urinary incontinence, female)   . Wears glasses     Past Surgical History:  Procedure Laterality Date  . ANKLE ARTHROSCOPY Right 2014  . CARPAL TUNNEL RELEASE Bilateral left 08-03-2007;   right 09-08-2007  . COLONOSCOPY WITH PROPOFOL N/A 04/10/2013   Procedure: COLONOSCOPY WITH PROPOFOL;  Surgeon: Charolett Bumpers, MD;  Location: [facility];  Service: Endoscopy;  Laterality: N/A;  . CYSTO/  RIGHT URETERAL STENT PLACEMENT  05/18/2005  . CYSTOSCOPY N/A 11/19/2016   Procedure: CYSTOSCOPY;  Surgeon: Jethro Bolus, MD;  Location: [facility];  Service: Urology;  Laterality: N/A;  . CYSTOSCOPY W/ URETERAL STENT PLACEMENT Left 10/21/2020   Procedure: CYSTOSCOPY WITH RETROGRADE PYELOGRAM/URETERAL STENT PLACEMENT;  Surgeon: [name], MD;  Location: [facility];  Service: Urology;  Laterality: Left;  . EXTRACORPOREAL SHOCK WAVE LITHOTRIPSY  x2  . LAPAROSCOPIC  APPENDECTOMY  04/03/2007  . NASAL SEPTUM SURGERY  1995  . POSTERIOR REPAIR /  TRANSVAGINAL TAPE Secure PLACEMENT  08/23/2007  . PUBOVAGINAL SLING N/A 11/19/2016   Procedure: EXCISION OF PROTRUDING MESH, PUBO-VAGINAL ALTIS SLING;  Surgeon: Jethro Bolus, MD;  Location: [facility];  Service: Urology;  Laterality: N/A;  . REMOVAL MID PORTION TVT TAPE  09/02/2008  . RIGHT LONG FINGER EXPLORATION/ DEBRIDEMENT FORGEIGN BODY GRANULOMA AND BX  10/20/2001  . SHOULDER ARTHROSCOPY W/ ROTATOR CUFF REPAIR Left 07/26/2005   and Left Total Knee manipulation  . TONSILLECTOMY  child  . TOTAL KNEE ARTHROPLASTY  04/17/2012   Procedure: TOTAL KNEE ARTHROPLASTY;  Surgeon: Nilda Simmer, MD;  Location: [facility];  Service: Orthopedics;  Laterality: Right;  right total knee arthroplasty  . TOTAL KNEE ARTHROPLASTY Left 11/04/2003    Family History  Problem Relation Age of Onset  . Arthritis Mother   . Heart attack Mother   . Glaucoma Mother   . Anesthesia problems Mother   . Alcohol abuse Father   . Seizures Father   . Arthritis Brother   . Hypertension Brother    Social History:  reports that she quit smoking about 25 years ago. Her smoking use included cigarettes. She quit after 20.00 years of use. She has never used smokeless tobacco. She reports that she does not drink alcohol and does not use drugs.  Allergies:  Allergies  Allergen Reactions  .  Hydrocodone Itching  . Oxycodone Itching  . Sulfa Antibiotics Itching    No medications prior to admission.    Results for orders placed or performed during the hospital encounter of 11/03/20 (from the past 48 hour(s))  SARS CORONAVIRUS 2 (TAT 6-24 HRS) Nasopharyngeal Nasopharyngeal Swab     Status: None   Collection Time: 11/03/20  9:22 AM   Specimen: Nasopharyngeal Swab  Result Value Ref Range   SARS Coronavirus 2 NEGATIVE NEGATIVE    Comment: (NOTE) SARS-CoV-2 target nucleic acids are NOT DETECTED.  The SARS-CoV-2 RNA is  generally detectable in upper and lower respiratory specimens during the acute phase of infection. Negative results do not preclude SARS-CoV-2 infection, do not rule out co-infections with other pathogens, and should not be used as the sole basis for treatment or other patient management decisions. Negative results must be combined with clinical observations, patient history, and epidemiological information. The expected result is Negative.  Fact Sheet for Patients: HairSlick.no  Fact Sheet for Healthcare Providers: quierodirigir.com  This test is not yet approved or cleared by the [place] FDA and  has been authorized for detection and/or diagnosis of SARS-CoV-2 by FDA under an Emergency Use Authorization (EUA). This EUA will remain  in effect (meaning this test can be used) for the duration of the COVID-19 declaration under Se ction 564(b)(1) of the Act, 21 U.S.C. section 360bbb-3(b)(1), unless the authorization is terminated or revoked sooner.  Performed at [facility], [address] [place], [place] [address]    No results found.  Review of Systems  Constitutional: Negative for chills and fever.  Genitourinary: Positive for urgency.  All other systems reviewed and are negative.   Height 5' (1.524 m), weight 91.6 kg. Physical Exam Vitals reviewed.  HENT:     Head: Normocephalic.  Eyes:     Pupils: Pupils are equal, round, and reactive to light.  Pulmonary:     Effort: Pulmonary effort is normal.  Abdominal:     General: Abdomen is flat.  Genitourinary:    Comments: No CVAT at present.  Musculoskeletal:        General: Normal range of motion.     Cervical back: Normal range of motion.  Skin:    General: Skin is warm.  Neurological:     General: No focal deficit present.     Mental Status: She is alert.  Psychiatric:        Mood and Affect: Mood normal.      Assessment/Plan  Proceed as  planned with Cysto, LEFT retrograde / ureteroscopy / later / stent exchange with goal of left side stone free. Risks, benefits, alternatives, expected peri-op course discussed previously and reiterated today.   [name], MD 11/05/2020, 7:37 AM

## 2020-11-05 NOTE — Anesthesia Procedure Notes (Signed)
Procedure Name: LMA Insertion Date/Time: 11/05/2020 11:38 AM Performed by: Bishop Limbo, CRNA Pre-anesthesia Checklist: Patient identified, Emergency Drugs available, Suction available and Patient being monitored Patient Re-evaluated:Patient Re-evaluated prior to induction Oxygen Delivery Method: Circle System Utilized Preoxygenation: Pre-oxygenation with 100% oxygen Induction Type: IV induction Ventilation: Mask ventilation without difficulty LMA: LMA inserted LMA Size: 4.0 Number of attempts: 1 Placement Confirmation: positive ETCO2 Tube secured with: Tape Dental Injury: Teeth and Oropharynx as per pre-operative assessment

## 2020-11-05 NOTE — Transfer of Care (Signed)
Immediate Anesthesia Transfer of Care Note  Patient: [name]  Procedure(s) Performed: CYSTOSCOPY WITH RETROGRADE PYELOGRAM, URETEROSCOPY AND STENT REPLACEMENT (Left Renal) HOLMIUM LASER APPLICATION (Left Renal)  Patient Location: PACU  Anesthesia Type:General  Level of Consciousness: awake, alert , oriented and patient cooperative  Airway & Oxygen Therapy: Patient Spontanous Breathing  Post-op Assessment: Report given to RN and Post -op Vital signs reviewed and stable  Post vital signs: Reviewed and stable  Last Vitals:  Vitals Value Taken Time  BP 181/102 11/05/20 1301  Temp    Pulse 96 11/05/20 1303  Resp 16 11/05/20 1303  SpO2 89 % 11/05/20 1303  Vitals shown include unvalidated device data.  Last Pain:  Vitals:   11/05/20 1020  TempSrc: Oral      Patients Stated Pain Goal: 7 (11/05/20 1020)  Complications: No complications documented.

## 2020-11-05 NOTE — Anesthesia Postprocedure Evaluation (Signed)
Anesthesia Post Note  Patient: [name]  Procedure(s) Performed: CYSTOSCOPY WITH RETROGRADE PYELOGRAM, URETEROSCOPY AND STENT REPLACEMENT (Left Renal) HOLMIUM LASER APPLICATION (Left Renal)     Patient location during evaluation: PACU Anesthesia Type: General Level of consciousness: awake and alert Pain management: pain level controlled Vital Signs Assessment: post-procedure vital signs reviewed and stable Respiratory status: spontaneous breathing, nonlabored ventilation, respiratory function stable and patient connected to nasal cannula oxygen Cardiovascular status: blood pressure returned to baseline and stable Postop Assessment: no apparent nausea or vomiting Anesthetic complications: no   No complications documented.  Last Vitals:  Vitals:   11/05/20 1300 11/05/20 1305  BP: (!) 181/102   Pulse: 96   Resp: 14   Temp: 36.6 C   SpO2: 91% 98%    Last Pain:  Vitals:   11/05/20 1020  TempSrc: Oral                 Christiane Sistare S

## 2020-11-06 ENCOUNTER — Encounter (HOSPITAL_BASED_OUTPATIENT_CLINIC_OR_DEPARTMENT_OTHER): Payer: Self-pay | Admitting: Urology

## 2020-11-06 NOTE — Op Note (Signed)
NAMEJODY, [name] [facility] MEDICAL RECORD [id number] ACCOUNT [id number] DATE OF BIRTH:04/23/1951 FACILITY: [facility] LOCATION: [facility] PHYSICIAN:Mashelle Busick, MD  OPERATIVE REPORT  DATE OF PROCEDURE:  11/05/2020  PREOPERATIVE DIAGNOSES:  Left renal and ureteral stones, history of pyelonephritis.  PROCEDURE: 1.  Cystoscopy, left retrograde pyelogram, interpretation. 2.  Left ureteroscopy with laser lithotripsy. 3.  Exchange left ureteral stent, 5 x 24 Polaris, no tether.  ESTIMATED BLOOD LOSS:  Nil.  COMPLICATIONS:  None.  SPECIMENS:  Left renal and ureteral stone fragments for composition analysis.  FINDINGS: 1.  Small volume left ureteral stone. 2.  Large volume left renal stone, mostly upper mid pole. 3.  Complete resolution of all accessible stone fragments larger than one-third mm following laser lithotripsy and basket extraction. 4.  Successful replacement of left ureteral stent, proximal end in renal pelvis, distal end in urinary bladder.  INDICATIONS:  The patient is a pleasant 69 year old lady who was found on workup of colicky flank pain as well as fever and malaise to have left ureteral stone and infectious parameters consistent with early pyelonephritis several weeks ago.  She  underwent stenting at that time for renal decompression and antibiotic therapy.  She has since cleared her systemic infectious parameters and now presents for definitive stone management with ureteroscopy.  Informed consent was then placed in medical  record.  DESCRIPTION OF PROCEDURE:  The patient being identified, procedure being left ureteroscopic stone manipulation was confirmed.  Procedure timeout was performed.  Intravenous antibiotics were administered.  General anesthesia induced.  The patient was  placed into a low lithotomy position.  Sterile field was created, prepped and draped the patient's vagina, introitus and proximal thighs using iodine.  Cystourethroscopy was performed using  21-French rigid cystoscope with offset lens.  Inspection of  bladder revealed no diverticula, calcifications, papillary lesions.  The left in situ stent was grasped at its distal end, brought to the level of the urethral meatus.  A 0.03 ZIPwire was advanced to lower pole.  The stent was exchanged for an open-ended  catheter and left retrograde pyelogram was obtained.  Left retrograde pyelogram demonstrated a single left ureter, single system left kidney.  There were 2 large calcifications within the kidney consistent with known stone.  ZIPwire was once again advanced and set aside as a safety wire.  An 8-French  feeding tube was placed in the urinary bladder for pressure release, and semirigid ureteroscopy was performed of the entire length of the left ureter alongside a separate sensor working wire.  There were 2 small ureteral stones, these were free floating.   These were retrograde positioned to the level of the kidney.  Next, a semirigid scope was exchanged for a 12/14 short length ureteral access sheath to the level of the proximal ureter.  Using continuous fluoroscopic guidance, flexible digital  ureteroscopy was performed of the proximal left ureter and systematic inspection of left kidney including all calices x3.  The 2 prior ureteral stones were retrograde positioned into an upper pole calix and 2 large intrarenal stones were noted.  One in  upper mid calix with a relatively narrow infundibulum and then another larger stone in the lower pole calix with a wide infundibulum.  The Escape basket was then used to grasp the larger stone.  It was retrograde positioned into upper pole calix to allow  for less acute angulation.  The small prior ureteral stones were amenable to simple basketing and removed.  Next, holmium laser energy applied to stones using settings of 1  joule and 10 Hz.  Using dusting technique, approximately 90% of stone volume was  dusted, the remaining 10% fragmented and these  fragments were amenable to simple basketing with the Escape basket.  They were removed and set aside for composition analysis.  Following this, there was excellent hemostasis, no evidence of renal  perforation.  There was complete resolution of all accessible stone fragments larger than one-third mm.  Given the large volume of the stone and dusting technique, it was felt that interval stenting with a nontethered stent would be most advantageous.   As such, the access sheath was removed under continuous vision, no mucosal abnormalities were found, and a new 5 x 24 Polaris-type stent was placed over remaining safety wire using fluoroscopic guidance.  Good proximal and distal planes were noted, and  the procedure was terminated.  The patient tolerated the procedure well.  No immediate perioperative complications.  The patient was taken to postanesthesia care in stable condition.  Plan for discharge home.  HN/NUANCE  D:11/05/2020 T:11/06/2020 JOB:[id number]

## 2020-11-12 ENCOUNTER — Other Ambulatory Visit: Payer: Self-pay | Admitting: Internal Medicine

## 2020-11-12 DIAGNOSIS — Z1231 Encounter for screening mammogram for malignant neoplasm of breast: Secondary | ICD-10-CM

## 2020-11-18 DIAGNOSIS — N200 Calculus of kidney: Secondary | ICD-10-CM | POA: Diagnosis not present

## 2020-12-01 DIAGNOSIS — R059 Cough, unspecified: Secondary | ICD-10-CM | POA: Diagnosis not present

## 2020-12-23 ENCOUNTER — Inpatient Hospital Stay: Admission: RE | Admit: 2020-12-23 | Payer: Medicare Other | Source: Ambulatory Visit

## 2020-12-27 ENCOUNTER — Other Ambulatory Visit: Payer: Self-pay

## 2020-12-27 ENCOUNTER — Ambulatory Visit
Admission: RE | Admit: 2020-12-27 | Discharge: 2020-12-27 | Disposition: A | Discharge status: Home / Self Care | Payer: Medicare Other | Source: Ambulatory Visit | Attending: Internal Medicine | Admitting: Internal Medicine

## 2020-12-27 DIAGNOSIS — Z1231 Encounter for screening mammogram for malignant neoplasm of breast: Secondary | ICD-10-CM

## 2021-02-10 DIAGNOSIS — I10 Essential (primary) hypertension: Secondary | ICD-10-CM | POA: Diagnosis not present

## 2021-02-10 DIAGNOSIS — N3281 Overactive bladder: Secondary | ICD-10-CM | POA: Diagnosis not present

## 2021-02-26 DIAGNOSIS — N3281 Overactive bladder: Secondary | ICD-10-CM | POA: Diagnosis not present

## 2021-02-26 DIAGNOSIS — I10 Essential (primary) hypertension: Secondary | ICD-10-CM | POA: Diagnosis not present

## 2021-03-24 DIAGNOSIS — M7751 Other enthesopathy of right foot: Secondary | ICD-10-CM | POA: Diagnosis not present

## 2021-04-10 DIAGNOSIS — I10 Essential (primary) hypertension: Secondary | ICD-10-CM | POA: Diagnosis not present

## 2021-05-08 DIAGNOSIS — I10 Essential (primary) hypertension: Secondary | ICD-10-CM | POA: Diagnosis not present

## 2021-07-20 DIAGNOSIS — S2231XA Fracture of one rib, right side, initial encounter for closed fracture: Secondary | ICD-10-CM | POA: Diagnosis not present

## 2021-07-20 DIAGNOSIS — S86311A Strain of muscle(s) and tendon(s) of peroneal muscle group at lower leg level, right leg, initial encounter: Secondary | ICD-10-CM | POA: Diagnosis not present

## 2021-07-30 DIAGNOSIS — M25571 Pain in right ankle and joints of right foot: Secondary | ICD-10-CM | POA: Diagnosis not present

## 2021-08-06 DIAGNOSIS — M25571 Pain in right ankle and joints of right foot: Secondary | ICD-10-CM | POA: Diagnosis not present

## 2021-08-06 DIAGNOSIS — S46011A Strain of muscle(s) and tendon(s) of the rotator cuff of right shoulder, initial encounter: Secondary | ICD-10-CM | POA: Diagnosis not present

## 2021-08-10 DIAGNOSIS — M25571 Pain in right ankle and joints of right foot: Secondary | ICD-10-CM | POA: Diagnosis not present

## 2021-08-19 DIAGNOSIS — R531 Weakness: Secondary | ICD-10-CM | POA: Diagnosis not present

## 2021-08-19 DIAGNOSIS — S86311A Strain of muscle(s) and tendon(s) of peroneal muscle group at lower leg level, right leg, initial encounter: Secondary | ICD-10-CM | POA: Diagnosis not present

## 2021-08-19 DIAGNOSIS — M25671 Stiffness of right ankle, not elsewhere classified: Secondary | ICD-10-CM | POA: Diagnosis not present

## 2021-08-21 DIAGNOSIS — H11422 Conjunctival edema, left eye: Secondary | ICD-10-CM | POA: Diagnosis not present

## 2021-08-24 DIAGNOSIS — M25671 Stiffness of right ankle, not elsewhere classified: Secondary | ICD-10-CM | POA: Diagnosis not present

## 2021-08-24 DIAGNOSIS — S86311A Strain of muscle(s) and tendon(s) of peroneal muscle group at lower leg level, right leg, initial encounter: Secondary | ICD-10-CM | POA: Diagnosis not present

## 2021-08-24 DIAGNOSIS — R531 Weakness: Secondary | ICD-10-CM | POA: Diagnosis not present

## 2021-08-31 DIAGNOSIS — R531 Weakness: Secondary | ICD-10-CM | POA: Diagnosis not present

## 2021-08-31 DIAGNOSIS — M25671 Stiffness of right ankle, not elsewhere classified: Secondary | ICD-10-CM | POA: Diagnosis not present

## 2021-08-31 DIAGNOSIS — S86311A Strain of muscle(s) and tendon(s) of peroneal muscle group at lower leg level, right leg, initial encounter: Secondary | ICD-10-CM | POA: Diagnosis not present

## 2021-10-06 DIAGNOSIS — Z23 Encounter for immunization: Secondary | ICD-10-CM | POA: Diagnosis not present

## 2021-10-06 DIAGNOSIS — I10 Essential (primary) hypertension: Secondary | ICD-10-CM | POA: Diagnosis not present

## 2021-10-06 DIAGNOSIS — Z0000 Encounter for general adult medical examination without abnormal findings: Secondary | ICD-10-CM | POA: Diagnosis not present

## 2021-10-06 DIAGNOSIS — E7800 Pure hypercholesterolemia, unspecified: Secondary | ICD-10-CM | POA: Diagnosis not present

## 2021-10-06 DIAGNOSIS — K219 Gastro-esophageal reflux disease without esophagitis: Secondary | ICD-10-CM | POA: Diagnosis not present

## 2021-10-06 DIAGNOSIS — Z1389 Encounter for screening for other disorder: Secondary | ICD-10-CM | POA: Diagnosis not present

## 2021-10-28 DIAGNOSIS — S91319A Laceration without foreign body, unspecified foot, initial encounter: Secondary | ICD-10-CM | POA: Diagnosis not present

## 2021-11-20 ENCOUNTER — Other Ambulatory Visit: Payer: Self-pay | Admitting: Internal Medicine

## 2021-11-20 DIAGNOSIS — Z1231 Encounter for screening mammogram for malignant neoplasm of breast: Secondary | ICD-10-CM

## 2021-12-28 ENCOUNTER — Other Ambulatory Visit: Payer: Self-pay

## 2021-12-28 ENCOUNTER — Ambulatory Visit
Admission: RE | Admit: 2021-12-28 | Discharge: 2021-12-28 | Disposition: A | Discharge status: Home / Self Care | Payer: Medicare Other | Source: Ambulatory Visit | Attending: Internal Medicine | Admitting: Internal Medicine

## 2021-12-28 DIAGNOSIS — Z1231 Encounter for screening mammogram for malignant neoplasm of breast: Secondary | ICD-10-CM | POA: Diagnosis not present

## 2021-12-29 ENCOUNTER — Ambulatory Visit
Admission: RE | Admit: 2021-12-29 | Discharge: 2021-12-29 | Disposition: A | Discharge status: Home / Self Care | Payer: Medicare Other | Source: Ambulatory Visit | Attending: Physician Assistant | Admitting: Physician Assistant

## 2021-12-29 ENCOUNTER — Other Ambulatory Visit: Payer: Self-pay | Admitting: Physician Assistant

## 2021-12-29 DIAGNOSIS — W19XXXA Unspecified fall, initial encounter: Secondary | ICD-10-CM

## 2021-12-29 DIAGNOSIS — K449 Diaphragmatic hernia without obstruction or gangrene: Secondary | ICD-10-CM | POA: Diagnosis not present

## 2021-12-29 DIAGNOSIS — R0781 Pleurodynia: Secondary | ICD-10-CM

## 2022-01-06 DIAGNOSIS — T783XXA Angioneurotic edema, initial encounter: Secondary | ICD-10-CM | POA: Diagnosis not present

## 2022-01-20 DIAGNOSIS — I10 Essential (primary) hypertension: Secondary | ICD-10-CM | POA: Diagnosis not present

## 2022-03-29 DIAGNOSIS — M2041 Other hammer toe(s) (acquired), right foot: Secondary | ICD-10-CM | POA: Diagnosis not present

## 2022-04-02 DIAGNOSIS — K219 Gastro-esophageal reflux disease without esophagitis: Secondary | ICD-10-CM | POA: Diagnosis not present

## 2022-04-02 DIAGNOSIS — Z23 Encounter for immunization: Secondary | ICD-10-CM | POA: Diagnosis not present

## 2022-04-02 DIAGNOSIS — E7800 Pure hypercholesterolemia, unspecified: Secondary | ICD-10-CM | POA: Diagnosis not present

## 2022-04-02 DIAGNOSIS — I10 Essential (primary) hypertension: Secondary | ICD-10-CM | POA: Diagnosis not present

## 2022-04-28 DIAGNOSIS — R0989 Other specified symptoms and signs involving the circulatory and respiratory systems: Secondary | ICD-10-CM | POA: Diagnosis not present

## 2022-04-28 DIAGNOSIS — Z03818 Encounter for observation for suspected exposure to other biological agents ruled out: Secondary | ICD-10-CM | POA: Diagnosis not present

## 2022-04-28 DIAGNOSIS — R051 Acute cough: Secondary | ICD-10-CM | POA: Diagnosis not present

## 2022-05-31 DIAGNOSIS — S46011A Strain of muscle(s) and tendon(s) of the rotator cuff of right shoulder, initial encounter: Secondary | ICD-10-CM | POA: Diagnosis not present

## 2022-05-31 DIAGNOSIS — S46012A Strain of muscle(s) and tendon(s) of the rotator cuff of left shoulder, initial encounter: Secondary | ICD-10-CM | POA: Diagnosis not present

## 2022-07-10 DIAGNOSIS — M25512 Pain in left shoulder: Secondary | ICD-10-CM | POA: Diagnosis not present

## 2022-07-13 DIAGNOSIS — S46012D Strain of muscle(s) and tendon(s) of the rotator cuff of left shoulder, subsequent encounter: Secondary | ICD-10-CM | POA: Diagnosis not present

## 2022-07-26 DIAGNOSIS — G8918 Other acute postprocedural pain: Secondary | ICD-10-CM | POA: Diagnosis not present

## 2022-07-26 DIAGNOSIS — M948X1 Other specified disorders of cartilage, shoulder: Secondary | ICD-10-CM | POA: Diagnosis not present

## 2022-07-26 DIAGNOSIS — M7541 Impingement syndrome of right shoulder: Secondary | ICD-10-CM | POA: Diagnosis not present

## 2022-07-26 DIAGNOSIS — M7522 Bicipital tendinitis, left shoulder: Secondary | ICD-10-CM | POA: Diagnosis not present

## 2022-07-26 DIAGNOSIS — M7521 Bicipital tendinitis, right shoulder: Secondary | ICD-10-CM | POA: Diagnosis not present

## 2022-07-26 DIAGNOSIS — M7552 Bursitis of left shoulder: Secondary | ICD-10-CM | POA: Diagnosis not present

## 2022-07-26 DIAGNOSIS — M24112 Other articular cartilage disorders, left shoulder: Secondary | ICD-10-CM | POA: Diagnosis not present

## 2022-07-26 DIAGNOSIS — M7501 Adhesive capsulitis of right shoulder: Secondary | ICD-10-CM | POA: Diagnosis not present

## 2022-07-26 DIAGNOSIS — M19012 Primary osteoarthritis, left shoulder: Secondary | ICD-10-CM | POA: Diagnosis not present

## 2022-07-26 DIAGNOSIS — M19011 Primary osteoarthritis, right shoulder: Secondary | ICD-10-CM | POA: Diagnosis not present

## 2022-08-02 DIAGNOSIS — M7502 Adhesive capsulitis of left shoulder: Secondary | ICD-10-CM | POA: Diagnosis not present

## 2022-08-02 DIAGNOSIS — M25612 Stiffness of left shoulder, not elsewhere classified: Secondary | ICD-10-CM | POA: Diagnosis not present

## 2022-08-02 DIAGNOSIS — M6281 Muscle weakness (generalized): Secondary | ICD-10-CM | POA: Diagnosis not present

## 2022-08-02 DIAGNOSIS — S46112D Strain of muscle, fascia and tendon of long head of biceps, left arm, subsequent encounter: Secondary | ICD-10-CM | POA: Diagnosis not present

## 2022-08-02 DIAGNOSIS — M7542 Impingement syndrome of left shoulder: Secondary | ICD-10-CM | POA: Diagnosis not present

## 2022-08-03 DIAGNOSIS — M19012 Primary osteoarthritis, left shoulder: Secondary | ICD-10-CM | POA: Diagnosis not present

## 2022-08-12 DIAGNOSIS — S46112D Strain of muscle, fascia and tendon of long head of biceps, left arm, subsequent encounter: Secondary | ICD-10-CM | POA: Diagnosis not present

## 2022-08-12 DIAGNOSIS — M7502 Adhesive capsulitis of left shoulder: Secondary | ICD-10-CM | POA: Diagnosis not present

## 2022-08-12 DIAGNOSIS — M25612 Stiffness of left shoulder, not elsewhere classified: Secondary | ICD-10-CM | POA: Diagnosis not present

## 2022-08-12 DIAGNOSIS — M6281 Muscle weakness (generalized): Secondary | ICD-10-CM | POA: Diagnosis not present

## 2022-08-12 DIAGNOSIS — M7542 Impingement syndrome of left shoulder: Secondary | ICD-10-CM | POA: Diagnosis not present

## 2022-08-17 DIAGNOSIS — M7502 Adhesive capsulitis of left shoulder: Secondary | ICD-10-CM | POA: Diagnosis not present

## 2022-08-23 DIAGNOSIS — M7502 Adhesive capsulitis of left shoulder: Secondary | ICD-10-CM | POA: Diagnosis not present

## 2022-08-23 DIAGNOSIS — S46112D Strain of muscle, fascia and tendon of long head of biceps, left arm, subsequent encounter: Secondary | ICD-10-CM | POA: Diagnosis not present

## 2022-08-23 DIAGNOSIS — M7542 Impingement syndrome of left shoulder: Secondary | ICD-10-CM | POA: Diagnosis not present

## 2022-08-23 DIAGNOSIS — M6281 Muscle weakness (generalized): Secondary | ICD-10-CM | POA: Diagnosis not present

## 2022-08-23 DIAGNOSIS — M25612 Stiffness of left shoulder, not elsewhere classified: Secondary | ICD-10-CM | POA: Diagnosis not present

## 2022-08-31 DIAGNOSIS — M25612 Stiffness of left shoulder, not elsewhere classified: Secondary | ICD-10-CM | POA: Diagnosis not present

## 2022-08-31 DIAGNOSIS — M7542 Impingement syndrome of left shoulder: Secondary | ICD-10-CM | POA: Diagnosis not present

## 2022-08-31 DIAGNOSIS — M6281 Muscle weakness (generalized): Secondary | ICD-10-CM | POA: Diagnosis not present

## 2022-08-31 DIAGNOSIS — S46112D Strain of muscle, fascia and tendon of long head of biceps, left arm, subsequent encounter: Secondary | ICD-10-CM | POA: Diagnosis not present

## 2022-08-31 DIAGNOSIS — M7502 Adhesive capsulitis of left shoulder: Secondary | ICD-10-CM | POA: Diagnosis not present

## 2022-09-06 DIAGNOSIS — M7542 Impingement syndrome of left shoulder: Secondary | ICD-10-CM | POA: Diagnosis not present

## 2022-09-06 DIAGNOSIS — M25612 Stiffness of left shoulder, not elsewhere classified: Secondary | ICD-10-CM | POA: Diagnosis not present

## 2022-09-06 DIAGNOSIS — M6281 Muscle weakness (generalized): Secondary | ICD-10-CM | POA: Diagnosis not present

## 2022-09-06 DIAGNOSIS — M7502 Adhesive capsulitis of left shoulder: Secondary | ICD-10-CM | POA: Diagnosis not present

## 2022-09-06 DIAGNOSIS — S46112D Strain of muscle, fascia and tendon of long head of biceps, left arm, subsequent encounter: Secondary | ICD-10-CM | POA: Diagnosis not present

## 2022-09-14 DIAGNOSIS — M7502 Adhesive capsulitis of left shoulder: Secondary | ICD-10-CM | POA: Diagnosis not present

## 2022-10-07 DIAGNOSIS — E7800 Pure hypercholesterolemia, unspecified: Secondary | ICD-10-CM | POA: Diagnosis not present

## 2022-10-07 DIAGNOSIS — I10 Essential (primary) hypertension: Secondary | ICD-10-CM | POA: Diagnosis not present

## 2022-10-07 DIAGNOSIS — K219 Gastro-esophageal reflux disease without esophagitis: Secondary | ICD-10-CM | POA: Diagnosis not present

## 2022-10-07 DIAGNOSIS — M179 Osteoarthritis of knee, unspecified: Secondary | ICD-10-CM | POA: Diagnosis not present

## 2022-10-07 DIAGNOSIS — R221 Localized swelling, mass and lump, neck: Secondary | ICD-10-CM | POA: Diagnosis not present

## 2022-10-07 DIAGNOSIS — Z23 Encounter for immunization: Secondary | ICD-10-CM | POA: Diagnosis not present

## 2022-10-07 DIAGNOSIS — Z0000 Encounter for general adult medical examination without abnormal findings: Secondary | ICD-10-CM | POA: Diagnosis not present

## 2022-10-07 DIAGNOSIS — N3281 Overactive bladder: Secondary | ICD-10-CM | POA: Diagnosis not present

## 2022-10-07 DIAGNOSIS — Z1211 Encounter for screening for malignant neoplasm of colon: Secondary | ICD-10-CM | POA: Diagnosis not present

## 2022-10-13 DIAGNOSIS — R221 Localized swelling, mass and lump, neck: Secondary | ICD-10-CM | POA: Diagnosis not present

## 2022-11-11 DIAGNOSIS — R051 Acute cough: Secondary | ICD-10-CM | POA: Diagnosis not present

## 2022-11-11 DIAGNOSIS — J029 Acute pharyngitis, unspecified: Secondary | ICD-10-CM | POA: Diagnosis not present

## 2022-11-29 ENCOUNTER — Other Ambulatory Visit: Payer: Self-pay | Admitting: Physician Assistant

## 2022-11-29 DIAGNOSIS — Z1231 Encounter for screening mammogram for malignant neoplasm of breast: Secondary | ICD-10-CM

## 2022-12-29 DIAGNOSIS — H5203 Hypermetropia, bilateral: Secondary | ICD-10-CM | POA: Diagnosis not present

## 2023-01-18 ENCOUNTER — Ambulatory Visit
Admission: RE | Admit: 2023-01-18 | Discharge: 2023-01-18 | Disposition: A | Discharge status: Home / Self Care | Payer: Medicare HMO | Source: Ambulatory Visit | Attending: Physician Assistant | Admitting: Physician Assistant

## 2023-01-18 DIAGNOSIS — Z1231 Encounter for screening mammogram for malignant neoplasm of breast: Secondary | ICD-10-CM | POA: Diagnosis not present

## 2023-01-22 DIAGNOSIS — H524 Presbyopia: Secondary | ICD-10-CM | POA: Diagnosis not present

## 2023-02-07 DIAGNOSIS — R300 Dysuria: Secondary | ICD-10-CM | POA: Diagnosis not present

## 2023-02-27 DIAGNOSIS — J018 Other acute sinusitis: Secondary | ICD-10-CM | POA: Diagnosis not present

## 2023-03-23 DIAGNOSIS — K219 Gastro-esophageal reflux disease without esophagitis: Secondary | ICD-10-CM | POA: Diagnosis not present

## 2023-03-23 DIAGNOSIS — E7800 Pure hypercholesterolemia, unspecified: Secondary | ICD-10-CM | POA: Diagnosis not present

## 2023-03-23 DIAGNOSIS — I10 Essential (primary) hypertension: Secondary | ICD-10-CM | POA: Diagnosis not present

## 2023-03-23 DIAGNOSIS — F325 Major depressive disorder, single episode, in full remission: Secondary | ICD-10-CM | POA: Diagnosis not present

## 2023-03-23 DIAGNOSIS — Z1211 Encounter for screening for malignant neoplasm of colon: Secondary | ICD-10-CM | POA: Diagnosis not present

## 2023-03-23 DIAGNOSIS — N3946 Mixed incontinence: Secondary | ICD-10-CM | POA: Diagnosis not present

## 2023-08-01 DIAGNOSIS — M79671 Pain in right foot: Secondary | ICD-10-CM | POA: Diagnosis not present

## 2023-08-01 DIAGNOSIS — R432 Parageusia: Secondary | ICD-10-CM | POA: Diagnosis not present

## 2023-08-01 DIAGNOSIS — R109 Unspecified abdominal pain: Secondary | ICD-10-CM | POA: Diagnosis not present

## 2023-11-11 DIAGNOSIS — M542 Cervicalgia: Secondary | ICD-10-CM | POA: Diagnosis not present

## 2023-11-11 DIAGNOSIS — M62838 Other muscle spasm: Secondary | ICD-10-CM | POA: Diagnosis not present

## 2024-04-09 DIAGNOSIS — R399 Unspecified symptoms and signs involving the genitourinary system: Secondary | ICD-10-CM | POA: Diagnosis not present

## 2024-04-18 DIAGNOSIS — H524 Presbyopia: Secondary | ICD-10-CM | POA: Diagnosis not present

## 2024-06-07 DIAGNOSIS — H52209 Unspecified astigmatism, unspecified eye: Secondary | ICD-10-CM | POA: Diagnosis not present

## 2024-06-07 DIAGNOSIS — H5203 Hypermetropia, bilateral: Secondary | ICD-10-CM | POA: Diagnosis not present

## 2024-06-07 DIAGNOSIS — H524 Presbyopia: Secondary | ICD-10-CM | POA: Diagnosis not present

## 2024-12-10 NOTE — Progress Notes (Signed)
 [name]                                          MRN: [id number]   12/10/2024   The [facility] Quality Team Specialist reviewed this patient medical record for the purposes of chart review for care gap closure. The following were reviewed: chart review for care gap closure-colorectal cancer screening.    [facility] Quality Team
